# Patient Record
Sex: Male | Born: 1945
Health system: Southern US, Community
[De-identification: ages and names within clinical notes are randomized; demographics above are authoritative.]

## PROBLEM LIST (undated history)

## (undated) DIAGNOSIS — Z9289 Personal history of other medical treatment: Secondary | ICD-10-CM

## (undated) DIAGNOSIS — E118 Type 2 diabetes mellitus with unspecified complications: Secondary | ICD-10-CM

## (undated) DIAGNOSIS — E0590 Thyrotoxicosis, unspecified without thyrotoxic crisis or storm: Secondary | ICD-10-CM

## (undated) DIAGNOSIS — I639 Cerebral infarction, unspecified: Secondary | ICD-10-CM

## (undated) DIAGNOSIS — I2510 Atherosclerotic heart disease of native coronary artery without angina pectoris: Principal | ICD-10-CM

## (undated) DIAGNOSIS — Z9861 Coronary angioplasty status: Principal | ICD-10-CM

## (undated) DIAGNOSIS — E785 Hyperlipidemia, unspecified: Secondary | ICD-10-CM

## (undated) DIAGNOSIS — I10 Essential (primary) hypertension: Secondary | ICD-10-CM

## (undated) DIAGNOSIS — K219 Gastro-esophageal reflux disease without esophagitis: Secondary | ICD-10-CM

## (undated) HISTORY — DX: Hyperlipidemia, unspecified: E78.5

## (undated) HISTORY — DX: Gastro-esophageal reflux disease without esophagitis: K21.9

## (undated) HISTORY — DX: Thyrotoxicosis, unspecified without thyrotoxic crisis or storm: E05.90

## (undated) HISTORY — DX: Essential (primary) hypertension: I10

## (undated) HISTORY — DX: Coronary angioplasty status: Z98.61

## (undated) HISTORY — DX: Atherosclerotic heart disease of native coronary artery without angina pectoris: I25.10

## (undated) HISTORY — DX: Type 2 diabetes mellitus with unspecified complications: E11.8

## (undated) HISTORY — DX: Personal history of other medical treatment: Z92.89

---

## 1981-10-24 HISTORY — PX: KNEE SURGERY: SHX244

## 1999-10-25 DIAGNOSIS — I2510 Atherosclerotic heart disease of native coronary artery without angina pectoris: Secondary | ICD-10-CM

## 1999-10-25 HISTORY — DX: Atherosclerotic heart disease of native coronary artery without angina pectoris: I25.10

## 2000-06-23 ENCOUNTER — Encounter: Admission: RE | Admit: 2000-06-23 | Discharge: 2000-06-23 | Payer: Self-pay | Admitting: Cardiology

## 2000-06-23 ENCOUNTER — Encounter: Payer: Self-pay | Admitting: Cardiology

## 2000-06-30 ENCOUNTER — Ambulatory Visit (HOSPITAL_COMMUNITY): Admission: RE | Admit: 2000-06-30 | Discharge: 2000-07-01 | Payer: Self-pay | Admitting: Cardiology

## 2000-06-30 HISTORY — PX: CORONARY ANGIOPLASTY WITH STENT PLACEMENT: SHX49

## 2000-08-01 ENCOUNTER — Encounter (HOSPITAL_COMMUNITY): Admission: RE | Admit: 2000-08-01 | Discharge: 2000-10-30 | Payer: Self-pay | Admitting: Cardiology

## 2000-10-31 ENCOUNTER — Encounter (HOSPITAL_COMMUNITY): Admission: RE | Admit: 2000-10-31 | Discharge: 2001-01-29 | Payer: Self-pay | Admitting: Cardiology

## 2001-05-09 ENCOUNTER — Encounter: Admission: RE | Admit: 2001-05-09 | Discharge: 2001-05-09 | Payer: Self-pay | Admitting: Gastroenterology

## 2001-05-09 ENCOUNTER — Encounter: Payer: Self-pay | Admitting: Gastroenterology

## 2001-05-25 ENCOUNTER — Ambulatory Visit (HOSPITAL_COMMUNITY): Admission: RE | Admit: 2001-05-25 | Discharge: 2001-05-25 | Payer: Self-pay | Admitting: Gastroenterology

## 2002-02-15 ENCOUNTER — Encounter: Payer: Self-pay | Admitting: Cardiology

## 2002-02-15 ENCOUNTER — Encounter: Admission: RE | Admit: 2002-02-15 | Discharge: 2002-02-15 | Payer: Self-pay | Admitting: Cardiology

## 2002-02-22 ENCOUNTER — Ambulatory Visit (HOSPITAL_COMMUNITY): Admission: RE | Admit: 2002-02-22 | Discharge: 2002-02-22 | Payer: Self-pay | Admitting: Cardiology

## 2002-02-23 ENCOUNTER — Inpatient Hospital Stay (HOSPITAL_COMMUNITY): Admission: EM | Admit: 2002-02-23 | Discharge: 2002-02-25 | Payer: Self-pay | Admitting: Emergency Medicine

## 2002-02-23 ENCOUNTER — Encounter: Payer: Self-pay | Admitting: Cardiology

## 2002-04-29 ENCOUNTER — Ambulatory Visit (HOSPITAL_COMMUNITY): Admission: RE | Admit: 2002-04-29 | Discharge: 2002-04-30 | Payer: Self-pay | Admitting: Cardiology

## 2002-04-29 HISTORY — PX: CORONARY ANGIOPLASTY WITH STENT PLACEMENT: SHX49

## 2002-05-10 DIAGNOSIS — I2510 Atherosclerotic heart disease of native coronary artery without angina pectoris: Secondary | ICD-10-CM | POA: Insufficient documentation

## 2003-04-09 ENCOUNTER — Ambulatory Visit (HOSPITAL_COMMUNITY): Admission: RE | Admit: 2003-04-09 | Discharge: 2003-04-09 | Payer: Self-pay | Admitting: Gastroenterology

## 2004-09-29 ENCOUNTER — Encounter: Admission: RE | Admit: 2004-09-29 | Discharge: 2004-09-29 | Payer: Self-pay | Admitting: Internal Medicine

## 2005-01-22 DIAGNOSIS — Z9289 Personal history of other medical treatment: Secondary | ICD-10-CM

## 2005-01-22 HISTORY — DX: Personal history of other medical treatment: Z92.89

## 2005-02-04 ENCOUNTER — Encounter: Admission: RE | Admit: 2005-02-04 | Discharge: 2005-02-04 | Payer: Self-pay | Admitting: Cardiology

## 2005-02-10 ENCOUNTER — Ambulatory Visit (HOSPITAL_COMMUNITY): Admission: RE | Admit: 2005-02-10 | Discharge: 2005-02-10 | Payer: Self-pay | Admitting: Cardiology

## 2005-02-10 HISTORY — PX: CARDIAC CATHETERIZATION: SHX172

## 2011-11-02 DIAGNOSIS — L909 Atrophic disorder of skin, unspecified: Secondary | ICD-10-CM | POA: Diagnosis not present

## 2011-11-02 DIAGNOSIS — L919 Hypertrophic disorder of the skin, unspecified: Secondary | ICD-10-CM | POA: Diagnosis not present

## 2011-11-02 DIAGNOSIS — L570 Actinic keratosis: Secondary | ICD-10-CM | POA: Diagnosis not present

## 2011-11-21 DIAGNOSIS — E039 Hypothyroidism, unspecified: Secondary | ICD-10-CM | POA: Diagnosis not present

## 2011-11-21 DIAGNOSIS — I10 Essential (primary) hypertension: Secondary | ICD-10-CM | POA: Diagnosis not present

## 2011-11-21 DIAGNOSIS — R5383 Other fatigue: Secondary | ICD-10-CM | POA: Diagnosis not present

## 2011-11-21 DIAGNOSIS — R5381 Other malaise: Secondary | ICD-10-CM | POA: Diagnosis not present

## 2011-11-21 DIAGNOSIS — E1129 Type 2 diabetes mellitus with other diabetic kidney complication: Secondary | ICD-10-CM | POA: Diagnosis not present

## 2012-02-20 DIAGNOSIS — H25099 Other age-related incipient cataract, unspecified eye: Secondary | ICD-10-CM | POA: Diagnosis not present

## 2012-03-26 DIAGNOSIS — E039 Hypothyroidism, unspecified: Secondary | ICD-10-CM | POA: Diagnosis not present

## 2012-03-26 DIAGNOSIS — I10 Essential (primary) hypertension: Secondary | ICD-10-CM | POA: Diagnosis not present

## 2012-03-26 DIAGNOSIS — E1129 Type 2 diabetes mellitus with other diabetic kidney complication: Secondary | ICD-10-CM | POA: Diagnosis not present

## 2012-03-26 DIAGNOSIS — I2510 Atherosclerotic heart disease of native coronary artery without angina pectoris: Secondary | ICD-10-CM | POA: Diagnosis not present

## 2012-04-10 DIAGNOSIS — H02409 Unspecified ptosis of unspecified eyelid: Secondary | ICD-10-CM | POA: Diagnosis not present

## 2012-04-10 DIAGNOSIS — H02839 Dermatochalasis of unspecified eye, unspecified eyelid: Secondary | ICD-10-CM | POA: Diagnosis not present

## 2012-05-14 DIAGNOSIS — I2510 Atherosclerotic heart disease of native coronary artery without angina pectoris: Secondary | ICD-10-CM | POA: Diagnosis not present

## 2012-05-14 DIAGNOSIS — E119 Type 2 diabetes mellitus without complications: Secondary | ICD-10-CM | POA: Diagnosis not present

## 2012-05-14 DIAGNOSIS — I10 Essential (primary) hypertension: Secondary | ICD-10-CM | POA: Diagnosis not present

## 2012-07-19 DIAGNOSIS — E785 Hyperlipidemia, unspecified: Secondary | ICD-10-CM | POA: Diagnosis not present

## 2012-07-19 DIAGNOSIS — Z125 Encounter for screening for malignant neoplasm of prostate: Secondary | ICD-10-CM | POA: Diagnosis not present

## 2012-07-19 DIAGNOSIS — E1129 Type 2 diabetes mellitus with other diabetic kidney complication: Secondary | ICD-10-CM | POA: Diagnosis not present

## 2012-07-19 DIAGNOSIS — E039 Hypothyroidism, unspecified: Secondary | ICD-10-CM | POA: Diagnosis not present

## 2012-07-26 DIAGNOSIS — E669 Obesity, unspecified: Secondary | ICD-10-CM | POA: Diagnosis not present

## 2012-07-26 DIAGNOSIS — Z0000 Encounter for general adult medical examination without abnormal findings: Secondary | ICD-10-CM | POA: Diagnosis not present

## 2012-07-26 DIAGNOSIS — I10 Essential (primary) hypertension: Secondary | ICD-10-CM | POA: Diagnosis not present

## 2012-07-26 DIAGNOSIS — Z23 Encounter for immunization: Secondary | ICD-10-CM | POA: Diagnosis not present

## 2012-07-26 DIAGNOSIS — E1129 Type 2 diabetes mellitus with other diabetic kidney complication: Secondary | ICD-10-CM | POA: Diagnosis not present

## 2012-07-31 DIAGNOSIS — Z1212 Encounter for screening for malignant neoplasm of rectum: Secondary | ICD-10-CM | POA: Diagnosis not present

## 2012-09-27 DIAGNOSIS — H53459 Other localized visual field defect, unspecified eye: Secondary | ICD-10-CM | POA: Diagnosis not present

## 2012-09-27 DIAGNOSIS — H02839 Dermatochalasis of unspecified eye, unspecified eyelid: Secondary | ICD-10-CM | POA: Diagnosis not present

## 2012-09-27 DIAGNOSIS — H02409 Unspecified ptosis of unspecified eyelid: Secondary | ICD-10-CM | POA: Diagnosis not present

## 2012-10-29 DIAGNOSIS — I2510 Atherosclerotic heart disease of native coronary artery without angina pectoris: Secondary | ICD-10-CM | POA: Diagnosis not present

## 2012-10-29 DIAGNOSIS — I10 Essential (primary) hypertension: Secondary | ICD-10-CM | POA: Diagnosis not present

## 2012-10-29 DIAGNOSIS — E1129 Type 2 diabetes mellitus with other diabetic kidney complication: Secondary | ICD-10-CM | POA: Diagnosis not present

## 2013-02-25 DIAGNOSIS — I10 Essential (primary) hypertension: Secondary | ICD-10-CM | POA: Diagnosis not present

## 2013-02-25 DIAGNOSIS — E1129 Type 2 diabetes mellitus with other diabetic kidney complication: Secondary | ICD-10-CM | POA: Diagnosis not present

## 2013-02-25 DIAGNOSIS — I2510 Atherosclerotic heart disease of native coronary artery without angina pectoris: Secondary | ICD-10-CM | POA: Diagnosis not present

## 2013-02-25 DIAGNOSIS — Z6841 Body Mass Index (BMI) 40.0 and over, adult: Secondary | ICD-10-CM | POA: Diagnosis not present

## 2013-03-13 DIAGNOSIS — L570 Actinic keratosis: Secondary | ICD-10-CM | POA: Diagnosis not present

## 2013-03-13 DIAGNOSIS — D239 Other benign neoplasm of skin, unspecified: Secondary | ICD-10-CM | POA: Diagnosis not present

## 2013-03-13 DIAGNOSIS — L821 Other seborrheic keratosis: Secondary | ICD-10-CM | POA: Diagnosis not present

## 2013-03-19 DIAGNOSIS — E119 Type 2 diabetes mellitus without complications: Secondary | ICD-10-CM | POA: Diagnosis not present

## 2013-03-19 DIAGNOSIS — H5200 Hypermetropia, unspecified eye: Secondary | ICD-10-CM | POA: Diagnosis not present

## 2013-03-19 DIAGNOSIS — H52229 Regular astigmatism, unspecified eye: Secondary | ICD-10-CM | POA: Diagnosis not present

## 2013-03-19 DIAGNOSIS — H2510 Age-related nuclear cataract, unspecified eye: Secondary | ICD-10-CM | POA: Diagnosis not present

## 2013-04-24 ENCOUNTER — Encounter: Payer: Self-pay | Admitting: *Deleted

## 2013-04-29 ENCOUNTER — Encounter: Payer: Self-pay | Admitting: Cardiology

## 2013-05-01 ENCOUNTER — Encounter: Payer: Self-pay | Admitting: Cardiology

## 2013-05-01 ENCOUNTER — Ambulatory Visit (INDEPENDENT_AMBULATORY_CARE_PROVIDER_SITE_OTHER): Payer: Medicare Other | Admitting: Cardiology

## 2013-05-01 VITALS — BP 126/84 | HR 58 | Ht 69.0 in | Wt 225.1 lb

## 2013-05-01 DIAGNOSIS — I10 Essential (primary) hypertension: Secondary | ICD-10-CM | POA: Diagnosis not present

## 2013-05-01 DIAGNOSIS — E669 Obesity, unspecified: Secondary | ICD-10-CM

## 2013-05-01 DIAGNOSIS — E1169 Type 2 diabetes mellitus with other specified complication: Secondary | ICD-10-CM | POA: Insufficient documentation

## 2013-05-01 DIAGNOSIS — E785 Hyperlipidemia, unspecified: Secondary | ICD-10-CM | POA: Diagnosis not present

## 2013-05-01 DIAGNOSIS — I2510 Atherosclerotic heart disease of native coronary artery without angina pectoris: Secondary | ICD-10-CM

## 2013-05-01 NOTE — Progress Notes (Signed)
Patient ID: Nicholas Hamilton, male   DOB: [birth year], 67 y.o.   MRN: [id number]  Clinic Note: HPI: Nicholas Hamilton is a 67 y.o. male with a PMH note will for 2 vessel PCI in the past who presents today for annual followup of CAD.   He is a former patient Dr. Caprice Kluver, who is seen for the first time. He was last seen last year, and was very stable. He is working harder to lose weight and has lost 6 pounds his last visit.  Interval History: He continues to do well. Expected activity now over last couple [facility] but still working out at the [employer] and treadmill and bike. He does have the a little less "get up and go "as he had the past, but thinks it may be just due to the heat of the summer.. Certainly if he really pushes it, he gets a bit short of breath. Otherwise he really denies any chest pain or shortness of breath rest or exertion. He denies any PND, orthopnea, or edema. No lightheadedness, dizziness, wooziness, sig be ordered near-syncope. No TIA or amaurosis fugax symptoms. No claudication symptoms. No melena, hematochezia or hematuria.  Past Medical History  Diagnosis Date  . CAD (coronary artery disease) 2001    PCI to RCA - 2001 - BMS; Cx 2003 - DES  . GERD (gastroesophageal reflux disease)   . Diabetes mellitus   . Hypertension   . Hyperlipidemia   . History of stress test 01/25/2005    mildly abnormal excerise cardiolite perfusion study with the patient exercising to a 7-MET workload and achieving a peak heart rate of 136, representing 84% of predicted maxium. the pateint develop significant dyspnea during the protocol and develop excerise-induced PVC's with ventricular bigeminy. he had nondiagnostic inferolateral ST-T changes.EF 47% = Non-obstructive CAD on cath  . Hyperthyroidism     Prior Cardiac Evaluation and Past Surgical History: Past Surgical History  Procedure Laterality Date  . Cardiac catheterization  02/10/2005    EF 50%  Mild left ventricular dysfuntion with a stent ejection fraction in posterior basilar segment hypokinesis, widely patent stents in the circumflex, High-grade stenosis is a very small posterior lateral branch with a large left to right collateral bed in the same area.  . Coronary angioplasty with stent placement  04/29/2002    STENTS: A 2.5 x 28 cypher stents was placed in such a manner that both the proximal and distal portion of the obstruction in the circumflex vessel were well covered.The initial inflation was13 atmospheres for 60 seconds with final inflation being 15 atmospheres for 60 second. each inflation, the patient had burning in the back of his throat and chest pressure; it resolved with deflating the ballon  . Coronary angioplasty with stent placement  06/30/2000    STENTS: A tetra stent 3.0 x 23, the stent was placed in such a manner that the dissection was well covered and deployed at 9 atmospheres for 59 seconds with the second inflation being 10 atmospheres for 57 seconds. the stent appeared to be slightly hyperexpanded ompared to the remainder of the vessel and there was a mild stent down at the distal portion of the stent.    Allergies  Allergen Reactions  . Penicillins     Current Outpatient Prescriptions  Medication Sig Dispense Refill  . amLODipine (NORVASC) 2.5 MG tablet Take 2.5 mg by mouth daily.      [name] aspirin 81 MG tablet Take  81 mg by mouth daily.      . carvedilol (COREG) 25 MG tablet Take 25 mg by mouth 2 (two) times daily with a meal.      . fenofibrate micronized (LOFIBRA) 200 MG capsule Take 200 mg by mouth daily before breakfast.      . fish oil-omega-3 fatty acids 1000 MG capsule Take 1 g by mouth daily.      [name] glimepiride (AMARYL) 1 MG tablet Take 1 mg by mouth daily before breakfast.      . hydrochlorothiazide (HYDRODIURIL) 25 MG tablet Take 12.5 mg by mouth daily.      . INVOKANA 300 MG TABS Take 1 tablet by mouth daily.      [name] levothyroxine (SYNTHROID,  LEVOTHROID) 150 MCG tablet Take 150 mcg by mouth daily before breakfast.      . lisinopril (PRINIVIL,ZESTRIL) 40 MG tablet Take 40 mg by mouth daily.      . metFORMIN (GLUCOPHAGE) 1000 MG tablet Take 1,000 mg by mouth 2 (two) times daily with a meal.      . naproxen (NAPROSYN) 250 MG tablet Take 250 mg by mouth 2 (two) times daily with a meal.      . nitroGLYCERIN (NITROSTAT) 0.4 MG SL tablet Place 0.4 mg under the tongue every 5 (five) minutes as needed for chest pain.      [name] omeprazole (PRILOSEC) 20 MG capsule Take 20 mg by mouth daily.      . simvastatin (ZOCOR) 40 MG tablet Take 40 mg by mouth every evening.       No current facility-administered medications for this visit.    History   Social History  . Marital Status: Married    Spouse Name: N/A    Number of Children: N/A  . Years of Education: N/A   Occupational History  . Not on file.   Social History Main Topics  . Smoking status: Former Smoker -- 1.00 packs/day for 40 years    Types: Cigarettes    Quit date: 10/24/2000  . Smokeless tobacco: Former Neurosurgeon    Types: Chew  . Alcohol Use: Yes     Comment: Very seldom  . Drug Use: No  . Sexually Active: Not on file   Other Topics Concern  . Not on file   Social History Narrative   : Married, 1 child. Chews tobacco, rare alcohol. [facility]. Talks about   wanting to lose weight.  Per Dr. Clarene Duke, he needs to continue his exercise and diet and stop snacking between meals.     ROS: A comprehensive Review of Systems - Negative except pertinent positives above; slightly less "get up & go"; ++ 6lb wgt loss!; positional vertigo (worse when lying down); mild Bilateral knee OA Sx.   PHYSICAL EXAM BP 126/84  Pulse 58  Ht 5\' 9"  (1.753 m)  Wt 225 lb 1.6 oz (102.105 kg)  BMI 33.23 kg/m2 General appearance: alert, cooperative, appears stated age, mild distress and mildly obese Neck: faint R carotid bruit; no LAD or JVD; supple, symmetrical, trachea midline and  thyroid not enlarged, symmetric, no tenderness/mass/nodules Lungs: clear to auscultation bilaterally, normal percussion bilaterally and non-labored, good air movement Heart: regular rate and rhythm, S1, S2 normal, no murmur, click, rub or gallop and normal apical impulse Abdomen: soft, non-tender; bowel sounds normal; no masses,  no organomegaly and mild-moderate truncal obesity Extremities: extremities normal, atraumatic, no cyanosis or edema, no edema, redness or tenderness in the calves or thighs and  no ulcers, gangrene or trophic changes Pulses: 2+ and symmetric Neurologic: Grossly normal; normal mood and affect HEENT: [place]/AT, EOMI, MMM, anicteric sclera   [id number] today: Yes Rate:58 , Rhythm: sinus bradycardia, Non-specific ST-T changes;    Recent Labs: Pending this fall   ASSESSMENT / PLAN: Stable from a cardiovascular standpoint.  BP & HR stable.    CAD S/P percutaneous coronary angioplasty Very stable, no active symptoms. On a good dose of beta blocker, aspirin, ACE inhibitor, statin. No active symptoms. Based on the back is a false positive stress test in the past, he is reluctant to have the screening/followup stress test done.  Obesity (BMI 30-39.9) Continue diet next a modification. He still as a snack at night which is minimal the problems he had in the past. Is hoping that once the heat of summer goes away, he is able to pick up his exercise level that would help him feel like exercising more  Hypertension Blood pressure well controlled, he is on a pretty good amount of medication with next dose carvedilol and lisinopril. His on a small dose of HCTZ and amlodipine.  Hyperlipidemia I believe his labs be followed by his primary. He is on fenofibrate as well as simvastatin. I've asked that he have his followup labs from Dr. Jarold Motto forwarded to her office when completed this fall.    Orders Placed This Encounter  Procedures  . EKG 12-Lead    Followup: 1  yr  DAVID W. Herbie Baltimore, M.D., M.S. [facility] [address] [place], [place]  [address]  [phone] Pager # [phone]

## 2013-05-01 NOTE — Patient Instructions (Signed)
You continue to be healthy from a heart perspective.  Your blood pressure & Heart rate look great. I look forward to seeing your cholesterol reports.  Keep it up with the routine exercise -- congratulations on ~6 lb weight loss in the past year!!!  I will simply see you back in a year.  Marykay Lex, MD

## 2013-05-10 ENCOUNTER — Encounter: Payer: Self-pay | Admitting: Cardiology

## 2013-05-10 DIAGNOSIS — E669 Obesity, unspecified: Secondary | ICD-10-CM | POA: Insufficient documentation

## 2013-05-10 DIAGNOSIS — E66811 Obesity, class 1: Secondary | ICD-10-CM | POA: Insufficient documentation

## 2013-05-10 NOTE — Assessment & Plan Note (Signed)
Blood pressure well controlled, he is on a pretty good amount of medication with next dose carvedilol and lisinopril. His on a small dose of HCTZ and amlodipine.

## 2013-05-10 NOTE — Assessment & Plan Note (Signed)
Very stable, no active symptoms. On a good dose of beta blocker, aspirin, ACE inhibitor, statin. No active symptoms. Based on the back is a false positive stress test in the past, he is reluctant to have the screening/followup stress test done.

## 2013-05-10 NOTE — Assessment & Plan Note (Signed)
Continue diet next a modification. He still as a snack at night which is minimal the problems he had in the past. Is hoping that once the heat of summer goes away, he is able to pick up his exercise level that would help him feel like exercising more

## 2013-05-10 NOTE — Assessment & Plan Note (Signed)
I believe his labs be followed by his primary. He is on fenofibrate as well as simvastatin. I've asked that he have his followup labs from Dr. Jarold Motto forwarded to her office when completed this fall.

## 2013-07-24 DIAGNOSIS — Z125 Encounter for screening for malignant neoplasm of prostate: Secondary | ICD-10-CM | POA: Diagnosis not present

## 2013-07-24 DIAGNOSIS — E039 Hypothyroidism, unspecified: Secondary | ICD-10-CM | POA: Diagnosis not present

## 2013-07-24 DIAGNOSIS — E785 Hyperlipidemia, unspecified: Secondary | ICD-10-CM | POA: Diagnosis not present

## 2013-07-31 DIAGNOSIS — Z0000 Encounter for general adult medical examination without abnormal findings: Secondary | ICD-10-CM | POA: Diagnosis not present

## 2013-07-31 DIAGNOSIS — I2510 Atherosclerotic heart disease of native coronary artery without angina pectoris: Secondary | ICD-10-CM | POA: Diagnosis not present

## 2013-07-31 DIAGNOSIS — Z79899 Other long term (current) drug therapy: Secondary | ICD-10-CM | POA: Diagnosis not present

## 2013-07-31 DIAGNOSIS — E669 Obesity, unspecified: Secondary | ICD-10-CM | POA: Diagnosis not present

## 2013-07-31 DIAGNOSIS — Z23 Encounter for immunization: Secondary | ICD-10-CM | POA: Diagnosis not present

## 2013-07-31 DIAGNOSIS — I10 Essential (primary) hypertension: Secondary | ICD-10-CM | POA: Diagnosis not present

## 2013-07-31 DIAGNOSIS — E039 Hypothyroidism, unspecified: Secondary | ICD-10-CM | POA: Diagnosis not present

## 2013-07-31 DIAGNOSIS — E785 Hyperlipidemia, unspecified: Secondary | ICD-10-CM | POA: Diagnosis not present

## 2013-07-31 DIAGNOSIS — E1129 Type 2 diabetes mellitus with other diabetic kidney complication: Secondary | ICD-10-CM | POA: Diagnosis not present

## 2013-07-31 DIAGNOSIS — Z1331 Encounter for screening for depression: Secondary | ICD-10-CM | POA: Diagnosis not present

## 2013-08-05 DIAGNOSIS — Z1212 Encounter for screening for malignant neoplasm of rectum: Secondary | ICD-10-CM | POA: Diagnosis not present

## 2013-11-27 DIAGNOSIS — N183 Chronic kidney disease, stage 3 unspecified: Secondary | ICD-10-CM | POA: Diagnosis not present

## 2013-11-27 DIAGNOSIS — I2510 Atherosclerotic heart disease of native coronary artery without angina pectoris: Secondary | ICD-10-CM | POA: Diagnosis not present

## 2013-11-27 DIAGNOSIS — Z6833 Body mass index (BMI) 33.0-33.9, adult: Secondary | ICD-10-CM | POA: Diagnosis not present

## 2013-11-27 DIAGNOSIS — E1129 Type 2 diabetes mellitus with other diabetic kidney complication: Secondary | ICD-10-CM | POA: Diagnosis not present

## 2013-11-27 DIAGNOSIS — E785 Hyperlipidemia, unspecified: Secondary | ICD-10-CM | POA: Diagnosis not present

## 2013-11-27 DIAGNOSIS — I10 Essential (primary) hypertension: Secondary | ICD-10-CM | POA: Diagnosis not present

## 2014-03-12 DIAGNOSIS — M1710 Unilateral primary osteoarthritis, unspecified knee: Secondary | ICD-10-CM | POA: Diagnosis not present

## 2014-04-15 DIAGNOSIS — M25569 Pain in unspecified knee: Secondary | ICD-10-CM | POA: Diagnosis not present

## 2014-04-15 DIAGNOSIS — M1710 Unilateral primary osteoarthritis, unspecified knee: Secondary | ICD-10-CM | POA: Diagnosis not present

## 2014-04-22 DIAGNOSIS — M25569 Pain in unspecified knee: Secondary | ICD-10-CM | POA: Diagnosis not present

## 2014-05-01 DIAGNOSIS — I2510 Atherosclerotic heart disease of native coronary artery without angina pectoris: Secondary | ICD-10-CM | POA: Diagnosis not present

## 2014-05-01 DIAGNOSIS — Z1331 Encounter for screening for depression: Secondary | ICD-10-CM | POA: Diagnosis not present

## 2014-05-01 DIAGNOSIS — I10 Essential (primary) hypertension: Secondary | ICD-10-CM | POA: Diagnosis not present

## 2014-05-01 DIAGNOSIS — Z6833 Body mass index (BMI) 33.0-33.9, adult: Secondary | ICD-10-CM | POA: Diagnosis not present

## 2014-05-01 DIAGNOSIS — N183 Chronic kidney disease, stage 3 unspecified: Secondary | ICD-10-CM | POA: Diagnosis not present

## 2014-05-01 DIAGNOSIS — I739 Peripheral vascular disease, unspecified: Secondary | ICD-10-CM | POA: Diagnosis not present

## 2014-05-01 DIAGNOSIS — E1129 Type 2 diabetes mellitus with other diabetic kidney complication: Secondary | ICD-10-CM | POA: Diagnosis not present

## 2014-05-08 DIAGNOSIS — IMO0002 Reserved for concepts with insufficient information to code with codable children: Secondary | ICD-10-CM | POA: Diagnosis not present

## 2014-05-09 ENCOUNTER — Other Ambulatory Visit: Payer: Self-pay | Admitting: Physician Assistant

## 2014-05-09 DIAGNOSIS — D485 Neoplasm of uncertain behavior of skin: Secondary | ICD-10-CM | POA: Diagnosis not present

## 2014-05-09 DIAGNOSIS — L570 Actinic keratosis: Secondary | ICD-10-CM | POA: Diagnosis not present

## 2014-05-09 DIAGNOSIS — C44211 Basal cell carcinoma of skin of unspecified ear and external auricular canal: Secondary | ICD-10-CM | POA: Diagnosis not present

## 2014-06-09 ENCOUNTER — Ambulatory Visit (INDEPENDENT_AMBULATORY_CARE_PROVIDER_SITE_OTHER): Payer: Medicare Other | Admitting: Cardiology

## 2014-06-09 ENCOUNTER — Telehealth: Payer: Self-pay | Admitting: Cardiology

## 2014-06-09 ENCOUNTER — Encounter: Payer: Self-pay | Admitting: Cardiology

## 2014-06-09 VITALS — BP 112/62 | HR 53 | Ht 68.5 in | Wt 220.1 lb

## 2014-06-09 DIAGNOSIS — E669 Obesity, unspecified: Secondary | ICD-10-CM

## 2014-06-09 DIAGNOSIS — I10 Essential (primary) hypertension: Secondary | ICD-10-CM | POA: Diagnosis not present

## 2014-06-09 DIAGNOSIS — Z9861 Coronary angioplasty status: Secondary | ICD-10-CM

## 2014-06-09 DIAGNOSIS — I2510 Atherosclerotic heart disease of native coronary artery without angina pectoris: Secondary | ICD-10-CM

## 2014-06-09 DIAGNOSIS — E785 Hyperlipidemia, unspecified: Secondary | ICD-10-CM

## 2014-06-09 NOTE — Patient Instructions (Signed)
Your physician encouraged you to lose weight for better health.  CONTINUE CURRENT MEDICATION  Your physician wants you to follow-up in [place].  You will receive a reminder letter in the mail two months in advance. If you don't receive a letter, please call our office to schedule the follow-up appointment.

## 2014-06-09 NOTE — Telephone Encounter (Signed)
Will make Nicholas Hamilton and dr harding aware

## 2014-06-09 NOTE — Telephone Encounter (Signed)
Pt has an appt today at 2 and she wants Dr Ellyn Hack to question him about his eating habit. She says patient eats everything he is not suppose to eat.

## 2014-06-10 ENCOUNTER — Encounter: Payer: Self-pay | Admitting: Cardiology

## 2014-06-10 MED ORDER — NITROGLYCERIN 0.4 MG SL SUBL: 0.4000 mg | SUBLINGUAL_TABLET | SUBLINGUAL | Status: DC | PRN

## 2014-06-10 NOTE — Assessment & Plan Note (Signed)
Well-controlled on current medications. To streamline his medications, to consider stopping amlodipine and increasing HCTZ 25 mg.

## 2014-06-10 NOTE — Progress Notes (Signed)
PCP: Donnajean Lopes, MD  Clinic Note: Chief Complaint  Patient presents with  . Follow-up    1 year. denies chest pain, dyspnea and swelling.     HPI: Nicholas Hamilton is a 68 y.o. male with a Cardiovascular Problem List below who presents today for his coronary disease status post PCI to RCA & Cx.  His "anginal equivalent was exertional dyspnea with hot and cold intolerance.  Interval History: He presents today for annual followup without any major complaints. He claims that he has lost more weight than his recorded but gained some back because he got out of sync with exercise over the last few months. He has bad knees --most notably the right,  and has been having to take care of his wife and mother who have been ill recently. He is trying to stay active but has not been exercising as well and has also not been paying attention to his diet because of time constraints. He denies any resting or exertional dyspnea or chest tightness/angina. No hot or cold intolerance.  No PND, orthopnea or edema.  No palpitations, lightheadedness, dizziness, weakness or syncope/near syncope. No TIA/amaurosis fugax symptoms. No melena, hematochezia, hematuria, or epstaxis. No claudication.  Past Medical History  Diagnosis Date  . CAD S/P percutaneous coronary angioplasty 2001    PCI to RCA - 2001 (Tetra BMS 3.0 mm x 23 mm)  - BMS; Cx 2003 - Cypher DES 2.5 mm x 28 mm; CAATH 2006 - 80% stenosis in prox RPL with L-R collaterals, too small for PCI - Med Rx  . History of stress test - Exercise Cardiolite 01/2005    mildly abnormal; 7-METS, peak HR 136 = 84% of max predicted; symptoms:significant dyspnea with exercise induced PVCs-ventricular bigeminy. nondiagnostic inferolateral ST-T changes.EF 47% = Non-obstructive CAD with the exception of 80% RPL stenosis on cath  . Diabetes mellitus type 2 with complications     CAD  . Hypertension, essential   . Hyperlipidemia LDL goal <70   . Hyperthyroidism   .  GERD (gastroesophageal reflux disease)    Prior Cardiac Evaluation and Past Surgical History: Reviewed in [place] [facility] No Change in Social and Family History  ROS: A comprehensive Review of Systems - was performed Review of Systems  Constitutional: Negative for fever, chills and malaise/fatigue.       No hot or cold intolerance  Musculoskeletal: Positive for joint pain. Negative for myalgias.       Bilateral but left greater than right knee pain. Has been seen by orthopedic surgery; trying to avoid surgery  All other systems reviewed and are negative.   Wt Readings from Last 3 Encounters:  06/09/14 220 lb 1.6 oz (99.837 kg)  05/01/13 225 lb 1.6 oz (102.105 kg)    PHYSICAL EXAM BP 112/62  Pulse 53  Ht 5' 8.5" (1.74 m)  Wt 220 lb 1.6 oz (99.837 kg)  BMI 32.98 kg/m2 General appearance: alert, cooperative, appears stated age, mild distress and mildly obese  HEENT: /AT, EOMI, MMM, anicteric sclera Neck: faint R carotid bruit; no LAD or JVD; supple, symmetrical, trachea midline and thyroid not enlarged, symmetric, no tenderness/mass/nodules  Lungs: clear to auscultation bilaterally, normal percussion bilaterally and non-labored, good air movement  Heart: regular rate and rhythm, S1, S2 normal, no murmur, click, rub or gallop and normal apical impulse  Abdomen: soft, non-tender; bowel sounds normal; no masses, no organomegaly and mild-moderate truncal obesity  Extremities: extremities normal, atraumatic, no cyanosis  or edema, no edema, redness or tenderness in the calves or thighs and no ulcers, gangrene or trophic changes  Pulses: 2+ and symmetric  Neurologic: Grossly normal; normal mood and affect    Adult ECG Report  Rate: 53 ;  Rhythm: sinus bradycardia and 1 AV block; otherwise normal EKG  Recent Labs not available. Monitored by PCP.:   ASSESSMENT / PLAN: CAD S/P percutaneous coronary angioplasty Stable with no active symptoms. Plan:  Continue current dose of carvedilol and lisinopril plus low-dose amlodipine. On aspirin and statin.  Hyperlipidemia with target LDL less than 70 Monitored by PCP. On statin plus fenofibrate.  Essential hypertension Well-controlled on current medications. To streamline his medications, to consider stopping amlodipine and increasing HCTZ 25 mg.  Obesity (BMI 30-39.9) Again he saw the palm of snacking in between meals. His wife is actually concerned enough to call and discuss this. He admits that he has not been doing for a while his dietary discretion. He eats out, and snacks all time on junk food. He basically admits that he eats whatever he wants without much thought. His wife was concerned enough to call in about this. I did spend some time counseling him on the importance of monitoring his diet and trying to adopt a more Mediterranean/DASH diet. This is in conjunction with increasing his exercise level.    Orders Placed This Encounter  Procedures  . EKG 12-Lead   Meds ordered this encounter  Medications  . nitroGLYCERIN (NITROSTAT) 0.4 MG SL tablet    Sig: Place 1 tablet (0.4 mg total) under the tongue every 5 (five) minutes as needed for chest pain.    Dispense:  25 tablet    Refill:  1    Followup: 12 months  DAVID W. Ellyn Hack, M.D., M.S. Interventional Cardiologist [facility]

## 2014-06-10 NOTE — Assessment & Plan Note (Signed)
Monitored by PCP. On statin plus fenofibrate.

## 2014-06-10 NOTE — Assessment & Plan Note (Signed)
Again he saw the palm of snacking in between meals. His wife is actually concerned enough to call and discuss this. He admits that he has not been doing for a while his dietary discretion. He eats out, and snacks all time on junk food. He basically admits that he eats whatever he wants without much thought. His wife was concerned enough to call in about this. I did spend some time counseling him on the importance of monitoring his diet and trying to adopt a more Mediterranean/DASH diet. This is in conjunction with increasing his exercise level.

## 2014-06-10 NOTE — Assessment & Plan Note (Signed)
Stable with no active symptoms. Plan: Continue current dose of carvedilol and lisinopril plus low-dose amlodipine. On aspirin and statin.

## 2014-06-12 DIAGNOSIS — C44211 Basal cell carcinoma of skin of unspecified ear and external auricular canal: Secondary | ICD-10-CM | POA: Diagnosis not present

## 2014-08-07 DIAGNOSIS — Z125 Encounter for screening for malignant neoplasm of prostate: Secondary | ICD-10-CM | POA: Diagnosis not present

## 2014-08-07 DIAGNOSIS — E785 Hyperlipidemia, unspecified: Secondary | ICD-10-CM | POA: Diagnosis not present

## 2014-08-07 DIAGNOSIS — E1129 Type 2 diabetes mellitus with other diabetic kidney complication: Secondary | ICD-10-CM | POA: Diagnosis not present

## 2014-08-07 DIAGNOSIS — E039 Hypothyroidism, unspecified: Secondary | ICD-10-CM | POA: Diagnosis not present

## 2014-08-14 DIAGNOSIS — Z23 Encounter for immunization: Secondary | ICD-10-CM | POA: Diagnosis not present

## 2014-08-14 DIAGNOSIS — E669 Obesity, unspecified: Secondary | ICD-10-CM | POA: Diagnosis not present

## 2014-08-14 DIAGNOSIS — I739 Peripheral vascular disease, unspecified: Secondary | ICD-10-CM | POA: Diagnosis not present

## 2014-08-14 DIAGNOSIS — I10 Essential (primary) hypertension: Secondary | ICD-10-CM | POA: Diagnosis not present

## 2014-08-14 DIAGNOSIS — Z0000 Encounter for general adult medical examination without abnormal findings: Secondary | ICD-10-CM | POA: Diagnosis not present

## 2014-08-14 DIAGNOSIS — N183 Chronic kidney disease, stage 3 (moderate): Secondary | ICD-10-CM | POA: Diagnosis not present

## 2014-08-14 DIAGNOSIS — E1151 Type 2 diabetes mellitus with diabetic peripheral angiopathy without gangrene: Secondary | ICD-10-CM | POA: Diagnosis not present

## 2014-08-14 DIAGNOSIS — I2510 Atherosclerotic heart disease of native coronary artery without angina pectoris: Secondary | ICD-10-CM | POA: Diagnosis not present

## 2014-08-14 DIAGNOSIS — Z6833 Body mass index (BMI) 33.0-33.9, adult: Secondary | ICD-10-CM | POA: Diagnosis not present

## 2014-08-15 DIAGNOSIS — Z1212 Encounter for screening for malignant neoplasm of rectum: Secondary | ICD-10-CM | POA: Diagnosis not present

## 2014-09-16 DIAGNOSIS — H5010 Unspecified exotropia: Secondary | ICD-10-CM | POA: Diagnosis not present

## 2014-09-16 DIAGNOSIS — H52203 Unspecified astigmatism, bilateral: Secondary | ICD-10-CM | POA: Diagnosis not present

## 2014-09-16 DIAGNOSIS — H25013 Cortical age-related cataract, bilateral: Secondary | ICD-10-CM | POA: Diagnosis not present

## 2014-10-27 DIAGNOSIS — M7541 Impingement syndrome of right shoulder: Secondary | ICD-10-CM | POA: Diagnosis not present

## 2014-10-29 DIAGNOSIS — D239 Other benign neoplasm of skin, unspecified: Secondary | ICD-10-CM | POA: Diagnosis not present

## 2014-10-29 DIAGNOSIS — L821 Other seborrheic keratosis: Secondary | ICD-10-CM | POA: Diagnosis not present

## 2014-11-13 DIAGNOSIS — H25811 Combined forms of age-related cataract, right eye: Secondary | ICD-10-CM | POA: Diagnosis not present

## 2014-11-13 DIAGNOSIS — H25011 Cortical age-related cataract, right eye: Secondary | ICD-10-CM | POA: Diagnosis not present

## 2014-11-13 DIAGNOSIS — H2511 Age-related nuclear cataract, right eye: Secondary | ICD-10-CM | POA: Diagnosis not present

## 2014-11-13 DIAGNOSIS — H25041 Posterior subcapsular polar age-related cataract, right eye: Secondary | ICD-10-CM | POA: Diagnosis not present

## 2014-12-11 DIAGNOSIS — E1151 Type 2 diabetes mellitus with diabetic peripheral angiopathy without gangrene: Secondary | ICD-10-CM | POA: Diagnosis not present

## 2014-12-11 DIAGNOSIS — Z6833 Body mass index (BMI) 33.0-33.9, adult: Secondary | ICD-10-CM | POA: Diagnosis not present

## 2014-12-11 DIAGNOSIS — R0683 Snoring: Secondary | ICD-10-CM | POA: Diagnosis not present

## 2014-12-11 DIAGNOSIS — E119 Type 2 diabetes mellitus without complications: Secondary | ICD-10-CM | POA: Diagnosis not present

## 2014-12-11 DIAGNOSIS — I10 Essential (primary) hypertension: Secondary | ICD-10-CM | POA: Diagnosis not present

## 2014-12-11 DIAGNOSIS — I739 Peripheral vascular disease, unspecified: Secondary | ICD-10-CM | POA: Diagnosis not present

## 2014-12-11 DIAGNOSIS — I2510 Atherosclerotic heart disease of native coronary artery without angina pectoris: Secondary | ICD-10-CM | POA: Diagnosis not present

## 2015-03-26 DIAGNOSIS — I10 Essential (primary) hypertension: Secondary | ICD-10-CM | POA: Diagnosis not present

## 2015-03-26 DIAGNOSIS — I2510 Atherosclerotic heart disease of native coronary artery without angina pectoris: Secondary | ICD-10-CM | POA: Diagnosis not present

## 2015-03-26 DIAGNOSIS — I739 Peripheral vascular disease, unspecified: Secondary | ICD-10-CM | POA: Diagnosis not present

## 2015-03-26 DIAGNOSIS — Z6832 Body mass index (BMI) 32.0-32.9, adult: Secondary | ICD-10-CM | POA: Diagnosis not present

## 2015-03-26 DIAGNOSIS — Z23 Encounter for immunization: Secondary | ICD-10-CM | POA: Diagnosis not present

## 2015-03-26 DIAGNOSIS — E1151 Type 2 diabetes mellitus with diabetic peripheral angiopathy without gangrene: Secondary | ICD-10-CM | POA: Diagnosis not present

## 2015-03-26 DIAGNOSIS — Z1389 Encounter for screening for other disorder: Secondary | ICD-10-CM | POA: Diagnosis not present

## 2015-03-31 DIAGNOSIS — H532 Diplopia: Secondary | ICD-10-CM | POA: Diagnosis not present

## 2015-04-30 DIAGNOSIS — H5015 Alternating exotropia: Secondary | ICD-10-CM | POA: Diagnosis not present

## 2015-06-11 DIAGNOSIS — M7541 Impingement syndrome of right shoulder: Secondary | ICD-10-CM | POA: Diagnosis not present

## 2015-06-11 DIAGNOSIS — M7542 Impingement syndrome of left shoulder: Secondary | ICD-10-CM | POA: Diagnosis not present

## 2015-06-18 DIAGNOSIS — M7541 Impingement syndrome of right shoulder: Secondary | ICD-10-CM | POA: Diagnosis not present

## 2015-06-18 DIAGNOSIS — M7542 Impingement syndrome of left shoulder: Secondary | ICD-10-CM | POA: Diagnosis not present

## 2015-06-19 ENCOUNTER — Ambulatory Visit (INDEPENDENT_AMBULATORY_CARE_PROVIDER_SITE_OTHER): Payer: Medicare Other | Admitting: Cardiology

## 2015-06-19 ENCOUNTER — Encounter: Payer: Self-pay | Admitting: Cardiology

## 2015-06-19 VITALS — BP 110/66 | HR 55 | Ht 69.0 in | Wt 213.0 lb

## 2015-06-19 DIAGNOSIS — I10 Essential (primary) hypertension: Secondary | ICD-10-CM | POA: Diagnosis not present

## 2015-06-19 DIAGNOSIS — Z9861 Coronary angioplasty status: Secondary | ICD-10-CM | POA: Diagnosis not present

## 2015-06-19 DIAGNOSIS — E785 Hyperlipidemia, unspecified: Secondary | ICD-10-CM

## 2015-06-19 DIAGNOSIS — E669 Obesity, unspecified: Secondary | ICD-10-CM | POA: Diagnosis not present

## 2015-06-19 DIAGNOSIS — I2510 Atherosclerotic heart disease of native coronary artery without angina pectoris: Secondary | ICD-10-CM

## 2015-06-19 NOTE — Patient Instructions (Signed)
NO CHANGE IN CURRENT MEDICATIONS  Your physician wants you to follow-up in 12 MONTHS DR HARDING.  You will receive a reminder letter in the mail two months in advance. If you don't receive a letter, please call our office to schedule the follow-up appointment.

## 2015-06-19 NOTE — Progress Notes (Signed)
PCP: Donnajean Lopes, MD  Clinic Note: Chief Complaint  Patient presents with  . Annual Exam    Patient has no complaints.  . Coronary Artery Disease    HPI: Nicholas Hamilton is a 69 y.o. male with a Cardiovascular Problem List below who presents today for his coronary disease status post PCI to RCA & Cx.  His "anginal equivalent was exertional dyspnea with hot and cold intolerance.  Usually goes to [facility] to exercise 3 d /wk -- but has missed a lot lately due to other distractions.  Interval History: He presents today for annual followup without any major complaints. He continues to exercise - but has been out of sync recently with other responsibilities.   He is trying to stay active but has not been exercising as well and has also not been paying attention to his diet because of time constraints. He denies any resting or exertional dyspnea or chest tightness/angina. No hot or cold intolerance.  No PND, orthopnea or edema.  No palpitations, weakness or syncope/near syncope.  However he does occasionally get a little lightheaded in the mornings after getting his medications. Not any syncopal episodes. No TIA/amaurosis fugax symptoms. No claudication.  He is due to have labs checked by his PCP in September.  Past Medical History  Diagnosis Date  . CAD S/P percutaneous coronary angioplasty 2001    PCI to RCA - 2001 (Tetra BMS 3.0 mm x 23 mm)  - BMS; Cx 2003 - Cypher DES 2.5 mm x 28 mm; CAATH 2006 - 80% stenosis in prox RPL with L-R collaterals, too small for PCI - Med Rx  . History of stress test - Exercise Cardiolite 01/2005    mildly abnormal; 7-METS, peak HR 136 = 84% of max predicted; symptoms:significant dyspnea with exercise induced PVCs-ventricular bigeminy. nondiagnostic inferolateral ST-T changes.EF 47% = Non-obstructive CAD with the exception of 80% RPL stenosis on cath  . Diabetes mellitus type 2 with complications     CAD  . Hypertension, essential   . Hyperlipidemia  LDL goal <70   . Hyperthyroidism   . GERD (gastroesophageal reflux disease)     ROS: A comprehensive Review of Systems - was performed Review of Systems  Constitutional: Positive for weight loss (Intentional). Negative for fever, chills and malaise/fatigue.       No hot or cold intolerance  Gastrointestinal: Negative for blood in stool and melena.  Genitourinary: Negative for hematuria.  Musculoskeletal: Positive for joint pain (Bilateral Shouder bursitis). Negative for myalgias.       Bilateral but left greater than right knee pain. Has been seen by orthopedic surgery; trying to avoid surgery  Endo/Heme/Allergies: Does not bruise/bleed easily.  All other systems reviewed and are negative.  Past Surgical History  Procedure Laterality Date  . Cardiac catheterization  02/10/2005    EF 50% Mild left ventricular dysfuntion with a stent ejection fraction in posterior basilar segment hypokinesis, widely patent stents in the circumflex, High-grade stenosis is a very small posterior lateral branch with a large left to right collateral bed in the same area.  . Coronary angioplasty with stent placement  04/29/2002    STENTS: A 2.5 x 28 cypher stents was placed in such a manner that both the proximal and distal portion of the obstruction in the circumflex vessel were well covered.The initial inflation was13 atmospheres for 60 seconds with final inflation being 15 atmospheres for 60 second. each inflation, the patient had burning in the back of his throat and chest  pressure; it resolved with deflating the ballon  . Coronary angioplasty with stent placement  06/30/2000    STENTS: A tetra stent 3.0 x 23, the stent was placed in such a manner that the dissection was well covered and deployed at 9 atmospheres for 59 seconds with the second inflation being 10 atmospheres for 57 seconds. the stent appeared to be slightly hyperexpanded ompared to the remainder of the vessel and there was a mild stent down at the  distal portion of the stent.   Prior to Admission medications   Medication Sig Start Date End Date Taking? Authorizing Provider  amLODipine (NORVASC) 2.5 MG tablet Take 2.5 mg by mouth daily.   Yes Historical Provider, MD  aspirin 81 MG tablet Take 81 mg by mouth daily.   Yes Historical Provider, MD  carvedilol (COREG) 25 MG tablet Take 25 mg by mouth 2 (two) times daily with a meal.   Yes Historical Provider, MD  fenofibrate micronized (LOFIBRA) 200 MG capsule Take 200 mg by mouth daily before breakfast.   Yes Historical Provider, MD  fish oil-omega-3 fatty acids 1000 MG capsule Take 1 g by mouth daily.   Yes Historical Provider, MD  glimepiride (AMARYL) 1 MG tablet Take 1 mg by mouth daily before breakfast.   Yes Historical Provider, MD  hydrochlorothiazide (HYDRODIURIL) 25 MG tablet Take 12.5 mg by mouth daily.   Yes Historical Provider, MD  INVOKANA 300 MG TABS Take 1 tablet by mouth daily. 04/19/13  Yes Historical Provider, MD  levothyroxine (SYNTHROID, LEVOTHROID) 150 MCG tablet Take 150 mcg by mouth daily before breakfast.   Yes Historical Provider, MD  lisinopril (PRINIVIL,ZESTRIL) 40 MG tablet Take 40 mg by mouth daily.   Yes Historical Provider, MD  metFORMIN (GLUCOPHAGE) 1000 MG tablet Take 1,000 mg by mouth 2 (two) times daily with a meal.   Yes Historical Provider, MD  naproxen (NAPROSYN) 250 MG tablet Take 250 mg by mouth 2 (two) times daily with a meal.   Yes Historical Provider, MD  nitroGLYCERIN (NITROSTAT) 0.4 MG SL tablet Place 1 tablet (0.4 mg total) under the tongue every 5 (five) minutes as needed for chest pain. 06/10/14  Yes Leonie Man, MD  omeprazole (PRILOSEC) 20 MG capsule Take 20 mg by mouth daily.   Yes Historical Provider, MD  simvastatin (ZOCOR) 40 MG tablet Take 40 mg by mouth every evening.   Yes Historical Provider, MD   Allergies  Allergen Reactions  . Penicillins    Family History  Problem Relation Age of Onset  . Diabetes Mother   . Stroke Mother     . Bell's palsy Mother   . Heart attack Mother   . Stroke Father     x 3  . Heart attack Father   . Arthritis Sister    Social History  Substance Use Topics  . Smoking status: Former Smoker -- 1.00 packs/day for 40 years    Types: Cigarettes    Quit date: 10/24/2000  . Smokeless tobacco: Former Systems developer    Types: Chew  . Alcohol Use: Yes     Comment: Very seldom    Wt Readings from Last 3 Encounters:  06/19/15 213 lb (96.616 kg)  06/09/14 220 lb 1.6 oz (99.837 kg)  05/01/13 225 lb 1.6 oz (102.105 kg)    PHYSICAL EXAM BP 110/66 mmHg  Pulse 55  Ht 5\' 9"  (1.753 m)  Wt 213 lb (96.616 kg)  BMI 31.44 kg/m2 General appearance: alert, cooperative, appears stated age, mild  distress and mildly obese  HEENT: [place]/AT, EOMI, MMM, anicteric sclera Neck: faint R carotid bruit; no LAD or JVD; supple, symmetrical, trachea midline and thyroid not enlarged, symmetric, no tenderness/mass/nodules  Lungs: clear to auscultation bilaterally, normal percussion bilaterally and non-labored, good air movement  Heart: regular rate and rhythm, S1, S2 normal, no murmur, click, rub or gallop and normal apical impulse  Abdomen: soft, non-tender; bowel sounds normal; no masses, no organomegaly and mild-moderate truncal obesity  Extremities: extremities normal, atraumatic, no cyanosis or edema, no edema, redness or tenderness in the calves or thighs and no ulcers, gangrene or trophic changes  Pulses: 2+ and symmetric  Neurologic: Grossly normal; normal mood and affect    Adult ECG Report  Rate: 55 ;  Rhythm: sinus bradycardia and 1 AV block; otherwise normal EKG - perhaps pooranterior R wave progression, suggesting possible anterior infarct, age undetermined. More likely consistent with lead placement. Stable EKG  Recent Labs not available. Monitored by PCP.:   ASSESSMENT / PLAN: Problem List Items Addressed This Visit    CAD S/P percutaneous coronary angioplasty (Chronic)    Stable with no active anginal  symptoms. Remains on aspirin, statin as well as beta blocker and ACE inhibitor. On calcium channel blocker for blood pressure as well as antianginal effect.      Essential hypertension - Primary (Chronic)    Well-controlled on current medications. At this point since he is having a little bit of dizziness with medications, it is recommended he takes amlodipine with the Indocin carvedilol as opposed to in the morning when the combination of HCTZ and ACE inhibitor. Consider dropping amlodipine if his blood pressure continues to run in the 110s.      Hyperlipidemia with target LDL less than 70 (Chronic)    He is on statin, fenofibrate and 3 fatty acids. Labs were monitored by PCP. I don't have recent values.      Obesity (BMI 30-39.9) (Chronic)    He has been doing a pretty good job with steady weight loss. 7 pounds in the last year which followed an additional 5 pounds prior to that. He has been trying really hard to adjust his diet and increase his exercise level. I congratulated him on his efforts. Hopefully if he can treat himself up from the time constraints, he'll be a be more active and had a better/more consistent with loss.        No change in current medications besides timing of amlodipine  No orders of the defined types were placed in this encounter.   No orders of the defined types were placed in this encounter.    Followup: 12 months  DAVID W. Ellyn Hack, M.D., M.S. Interventional Cardiologist [facility]

## 2015-06-21 ENCOUNTER — Encounter: Payer: Self-pay | Admitting: Cardiology

## 2015-06-21 NOTE — Assessment & Plan Note (Signed)
Well-controlled on current medications. At this point since he is having a little bit of dizziness with medications, it is recommended he takes amlodipine with the Indocin carvedilol as opposed to in the morning when the combination of HCTZ and ACE inhibitor. Consider dropping amlodipine if his blood pressure continues to run in the 110s.

## 2015-06-21 NOTE — Assessment & Plan Note (Signed)
He is on statin, fenofibrate and 3 fatty acids. Labs were monitored by PCP. I don't have recent values.

## 2015-06-21 NOTE — Assessment & Plan Note (Signed)
He has been doing a pretty good job with steady weight loss. 7 pounds in the last year which followed an additional 5 pounds prior to that. He has been trying really hard to adjust his diet and increase his exercise level. I congratulated him on his efforts. Hopefully if he can treat himself up from the time constraints, he'll be a be more active and had a better/more consistent with loss.

## 2015-06-21 NOTE — Assessment & Plan Note (Signed)
Stable with no active anginal symptoms. Remains on aspirin, statin as well as beta blocker and ACE inhibitor. On calcium channel blocker for blood pressure as well as antianginal effect.

## 2015-06-24 DIAGNOSIS — L570 Actinic keratosis: Secondary | ICD-10-CM | POA: Diagnosis not present

## 2015-06-24 DIAGNOSIS — M7542 Impingement syndrome of left shoulder: Secondary | ICD-10-CM | POA: Diagnosis not present

## 2015-06-24 DIAGNOSIS — Z85828 Personal history of other malignant neoplasm of skin: Secondary | ICD-10-CM | POA: Diagnosis not present

## 2015-06-24 DIAGNOSIS — M7541 Impingement syndrome of right shoulder: Secondary | ICD-10-CM | POA: Diagnosis not present

## 2015-06-24 DIAGNOSIS — D239 Other benign neoplasm of skin, unspecified: Secondary | ICD-10-CM | POA: Diagnosis not present

## 2015-06-30 DIAGNOSIS — M7541 Impingement syndrome of right shoulder: Secondary | ICD-10-CM | POA: Diagnosis not present

## 2015-06-30 DIAGNOSIS — M7542 Impingement syndrome of left shoulder: Secondary | ICD-10-CM | POA: Diagnosis not present

## 2015-07-08 DIAGNOSIS — M7542 Impingement syndrome of left shoulder: Secondary | ICD-10-CM | POA: Diagnosis not present

## 2015-07-08 DIAGNOSIS — M7541 Impingement syndrome of right shoulder: Secondary | ICD-10-CM | POA: Diagnosis not present

## 2015-07-16 DIAGNOSIS — M7541 Impingement syndrome of right shoulder: Secondary | ICD-10-CM | POA: Diagnosis not present

## 2015-07-16 DIAGNOSIS — M7542 Impingement syndrome of left shoulder: Secondary | ICD-10-CM | POA: Diagnosis not present

## 2015-07-22 DIAGNOSIS — M7541 Impingement syndrome of right shoulder: Secondary | ICD-10-CM | POA: Diagnosis not present

## 2015-07-22 DIAGNOSIS — M7501 Adhesive capsulitis of right shoulder: Secondary | ICD-10-CM | POA: Diagnosis not present

## 2015-08-17 DIAGNOSIS — Z125 Encounter for screening for malignant neoplasm of prostate: Secondary | ICD-10-CM | POA: Diagnosis not present

## 2015-08-17 DIAGNOSIS — N183 Chronic kidney disease, stage 3 (moderate): Secondary | ICD-10-CM | POA: Diagnosis not present

## 2015-08-17 DIAGNOSIS — E039 Hypothyroidism, unspecified: Secondary | ICD-10-CM | POA: Diagnosis not present

## 2015-08-17 DIAGNOSIS — E785 Hyperlipidemia, unspecified: Secondary | ICD-10-CM | POA: Diagnosis not present

## 2015-08-17 DIAGNOSIS — E1129 Type 2 diabetes mellitus with other diabetic kidney complication: Secondary | ICD-10-CM | POA: Diagnosis not present

## 2015-08-24 DIAGNOSIS — K219 Gastro-esophageal reflux disease without esophagitis: Secondary | ICD-10-CM | POA: Diagnosis not present

## 2015-08-24 DIAGNOSIS — E039 Hypothyroidism, unspecified: Secondary | ICD-10-CM | POA: Diagnosis not present

## 2015-08-24 DIAGNOSIS — I739 Peripheral vascular disease, unspecified: Secondary | ICD-10-CM | POA: Diagnosis not present

## 2015-08-24 DIAGNOSIS — Z23 Encounter for immunization: Secondary | ICD-10-CM | POA: Diagnosis not present

## 2015-08-24 DIAGNOSIS — N183 Chronic kidney disease, stage 3 (moderate): Secondary | ICD-10-CM | POA: Diagnosis not present

## 2015-08-24 DIAGNOSIS — Z0000 Encounter for general adult medical examination without abnormal findings: Secondary | ICD-10-CM | POA: Diagnosis not present

## 2015-08-24 DIAGNOSIS — I10 Essential (primary) hypertension: Secondary | ICD-10-CM | POA: Diagnosis not present

## 2015-08-24 DIAGNOSIS — I2510 Atherosclerotic heart disease of native coronary artery without angina pectoris: Secondary | ICD-10-CM | POA: Diagnosis not present

## 2015-08-24 DIAGNOSIS — E1151 Type 2 diabetes mellitus with diabetic peripheral angiopathy without gangrene: Secondary | ICD-10-CM | POA: Diagnosis not present

## 2015-08-24 DIAGNOSIS — E784 Other hyperlipidemia: Secondary | ICD-10-CM | POA: Diagnosis not present

## 2015-08-24 DIAGNOSIS — Z1389 Encounter for screening for other disorder: Secondary | ICD-10-CM | POA: Diagnosis not present

## 2015-08-24 DIAGNOSIS — Z6833 Body mass index (BMI) 33.0-33.9, adult: Secondary | ICD-10-CM | POA: Diagnosis not present

## 2015-08-25 DIAGNOSIS — Z1212 Encounter for screening for malignant neoplasm of rectum: Secondary | ICD-10-CM | POA: Diagnosis not present

## 2015-08-26 ENCOUNTER — Other Ambulatory Visit: Payer: Self-pay | Admitting: Internal Medicine

## 2015-08-26 DIAGNOSIS — H52203 Unspecified astigmatism, bilateral: Secondary | ICD-10-CM | POA: Diagnosis not present

## 2015-08-26 DIAGNOSIS — H2512 Age-related nuclear cataract, left eye: Secondary | ICD-10-CM | POA: Diagnosis not present

## 2015-08-26 DIAGNOSIS — E119 Type 2 diabetes mellitus without complications: Secondary | ICD-10-CM | POA: Diagnosis not present

## 2015-08-26 DIAGNOSIS — N183 Chronic kidney disease, stage 3 unspecified: Secondary | ICD-10-CM

## 2015-08-26 DIAGNOSIS — H5010 Unspecified exotropia: Secondary | ICD-10-CM | POA: Diagnosis not present

## 2015-08-28 ENCOUNTER — Other Ambulatory Visit: Payer: Medicare Other

## 2015-08-31 ENCOUNTER — Ambulatory Visit
Admission: RE | Admit: 2015-08-31 | Discharge: 2015-08-31 | Disposition: A | Discharge status: Home / Self Care | Payer: Medicare Other | Source: Ambulatory Visit | Attending: Internal Medicine | Admitting: Internal Medicine

## 2015-08-31 DIAGNOSIS — N183 Chronic kidney disease, stage 3 unspecified: Secondary | ICD-10-CM

## 2015-09-16 DIAGNOSIS — R5383 Other fatigue: Secondary | ICD-10-CM | POA: Diagnosis not present

## 2015-09-16 DIAGNOSIS — R52 Pain, unspecified: Secondary | ICD-10-CM | POA: Diagnosis not present

## 2015-09-16 DIAGNOSIS — J069 Acute upper respiratory infection, unspecified: Secondary | ICD-10-CM | POA: Diagnosis not present

## 2015-09-20 ENCOUNTER — Encounter (HOSPITAL_BASED_OUTPATIENT_CLINIC_OR_DEPARTMENT_OTHER): Payer: Self-pay | Admitting: *Deleted

## 2015-09-20 ENCOUNTER — Emergency Department (HOSPITAL_BASED_OUTPATIENT_CLINIC_OR_DEPARTMENT_OTHER): Payer: Medicare Other

## 2015-09-20 ENCOUNTER — Emergency Department (HOSPITAL_BASED_OUTPATIENT_CLINIC_OR_DEPARTMENT_OTHER)
Admission: EM | Admit: 2015-09-20 | Discharge: 2015-09-20 | Disposition: A | Discharge status: Home / Self Care | Payer: Medicare Other | Attending: Emergency Medicine | Admitting: Emergency Medicine

## 2015-09-20 ENCOUNTER — Other Ambulatory Visit: Payer: Self-pay

## 2015-09-20 ENCOUNTER — Telehealth: Payer: Self-pay | Admitting: Nurse Practitioner

## 2015-09-20 DIAGNOSIS — I6789 Other cerebrovascular disease: Secondary | ICD-10-CM | POA: Diagnosis not present

## 2015-09-20 DIAGNOSIS — R29810 Facial weakness: Secondary | ICD-10-CM

## 2015-09-20 DIAGNOSIS — G510 Bell's palsy: Secondary | ICD-10-CM | POA: Insufficient documentation

## 2015-09-20 DIAGNOSIS — R51 Headache: Secondary | ICD-10-CM | POA: Diagnosis not present

## 2015-09-20 DIAGNOSIS — R05 Cough: Secondary | ICD-10-CM | POA: Diagnosis not present

## 2015-09-20 DIAGNOSIS — I440 Atrioventricular block, first degree: Secondary | ICD-10-CM | POA: Diagnosis not present

## 2015-09-20 DIAGNOSIS — R001 Bradycardia, unspecified: Secondary | ICD-10-CM | POA: Diagnosis not present

## 2015-09-20 HISTORY — DX: Cerebral infarction, unspecified: I63.9

## 2015-09-20 LAB — COMPREHENSIVE METABOLIC PANEL
ALBUMIN: 3.8 g/dL (ref 3.5–5.0)
ALT: 16 U/L — ABNORMAL LOW (ref 17–63)
ANION GAP: 8 (ref 5–15)
AST: 19 U/L (ref 15–41)
Alkaline Phosphatase: 26 U/L — ABNORMAL LOW (ref 38–126)
BILIRUBIN TOTAL: 0.6 mg/dL (ref 0.3–1.2)
BUN: 23 mg/dL — ABNORMAL HIGH (ref 6–20)
CALCIUM: 9.7 mg/dL (ref 8.9–10.3)
CO2: 24 mmol/L (ref 22–32)
Chloride: 104 mmol/L (ref 101–111)
Creatinine, Ser: 1.64 mg/dL — ABNORMAL HIGH (ref 0.61–1.24)
GFR calc non Af Amer: 41 mL/min — ABNORMAL LOW (ref >60)
GFR, EST AFRICAN AMERICAN: 48 mL/min — AB (ref >60)
GLUCOSE: 201 mg/dL — AB (ref 65–99)
POTASSIUM: 3.4 mmol/L — AB (ref 3.5–5.1)
SODIUM: 136 mmol/L (ref 135–145)
TOTAL PROTEIN: 7.2 g/dL (ref 6.5–8.1)

## 2015-09-20 LAB — DIFFERENTIAL
Basophils Absolute: 0 10*3/uL (ref 0.0–0.1)
Basophils Relative: 1 %
EOS ABS: 0.2 10*3/uL (ref 0.0–0.7)
EOS PCT: 3 %
LYMPHS ABS: 2.1 10*3/uL (ref 0.7–4.0)
Lymphocytes Relative: 28 %
MONO ABS: 0.6 10*3/uL (ref 0.1–1.0)
Monocytes Relative: 8 %
NEUTROS PCT: 60 %
Neutro Abs: 4.5 10*3/uL (ref 1.7–7.7)

## 2015-09-20 LAB — CBC
HCT: 38.6 % — ABNORMAL LOW (ref 39.0–52.0)
Hemoglobin: 13.1 g/dL (ref 13.0–17.0)
MCH: 29.8 pg (ref 26.0–34.0)
MCHC: 33.9 g/dL (ref 30.0–36.0)
MCV: 87.9 fL (ref 78.0–100.0)
PLATELETS: 218 10*3/uL (ref 150–400)
RBC: 4.39 MIL/uL (ref 4.22–5.81)
RDW: 13.9 % (ref 11.5–15.5)
WBC: 7.5 10*3/uL (ref 4.0–10.5)

## 2015-09-20 LAB — PROTIME-INR
INR: 1.06 (ref 0.00–1.49)
PROTHROMBIN TIME: 14 s (ref 11.6–15.2)

## 2015-09-20 LAB — TROPONIN I: Troponin I: <0.03 ng/mL (ref <0.031)

## 2015-09-20 LAB — APTT: aPTT: 31 seconds (ref 24–37)

## 2015-09-20 MED ORDER — ACYCLOVIR 400 MG PO TABS: 400.0000 mg | ORAL_TABLET | Freq: Every day | ORAL | Status: DC

## 2015-09-20 MED ORDER — ACYCLOVIR 400 MG PO TABS: 400.0000 mg | ORAL_TABLET | Freq: Every day | ORAL | Status: AC

## 2015-09-20 MED ORDER — ERYTHROMYCIN 5 MG/GM OP OINT: TOPICAL_OINTMENT | OPHTHALMIC | Status: DC

## 2015-09-20 MED ORDER — PREDNISONE 50 MG PO TABS
50.0000 mg | ORAL_TABLET | Freq: Every day | ORAL | Status: DC
Start: 2015-09-20 — End: 2015-09-20

## 2015-09-20 MED ORDER — PREDNISONE 50 MG PO TABS: 50.0000 mg | ORAL_TABLET | Freq: Every day | ORAL | Status: AC

## 2015-09-20 NOTE — ED Notes (Signed)
Pt reports he noticed some facial drooping on Friday; pt has hx of stroke in 2002 (denies being on blood thinners).

## 2015-09-20 NOTE — ED Notes (Signed)
Per EMS, pt being transferred to this facility from [facility] for MRI to rule out stroke. Pt presents with left side facial droop. Pt alert x4. NAD at this time.

## 2015-09-20 NOTE — ED Provider Notes (Signed)
CSN: [id number]     Arrival date & time 09/20/15  1434 History  By signing my name below, I, Erling Conte, attest that this documentation has been prepared under the direction and in the presence of Malvin Johns, MD. Electronically Signed: Erling Conte, ED Scribe. 09/20/2015. 4:47 PM.    Chief Complaint  Patient presents with  . Facial Droop   The history is provided by the patient. No language interpreter was used.    HPI Comments: Nicholas Hamilton is a 69 y.o. male with a h/o CAD, DM type II, HTN, and stroke with residual mild right leg weakness who presents to the Emergency Department complaining of a mild left sided facial droop onset 2 days ago that began to gradually worsen today. He reports associated watery discharge from his left eye, nasal congestion, fatigue, mild abdominal pain and episodic diarrhea. Pt notes he has a h/o stoke which occurred in 2002 with right sided deficits. Pt also endorses that he has been sick for over a week with sinus issues and cough/congestion. He takes 81mg  ASA daily but has not taken the medication today. He denies any aggravating/alleviating factors. Pt's mother has a h/o Bell's Palsy. He denies any left sided extremities numbness or weakness, HA, vision difficulty, slurred speech, hearing loss, or fever.  Past Medical History  Diagnosis Date  . CAD S/P percutaneous coronary angioplasty 2001    PCI to RCA - 2001 (Tetra BMS 3.0 mm x 23 mm)  - BMS; Cx 2003 - Cypher DES 2.5 mm x 28 mm; CAATH 2006 - 80% stenosis in prox RPL with L-R collaterals, too small for PCI - Med Rx  . History of stress test - Exercise Cardiolite 01/2005    mildly abnormal; 7-METS, peak HR 136 = 84% of max predicted; symptoms:significant dyspnea with exercise induced PVCs-ventricular bigeminy. nondiagnostic inferolateral ST-T changes.EF 47% = Non-obstructive CAD with the exception of 80% RPL stenosis on cath  . Diabetes mellitus type 2 with complications (HCC)     CAD  .  Hypertension, essential   . Hyperlipidemia LDL goal <70   . Hyperthyroidism   . GERD (gastroesophageal reflux disease)   . Stroke [facility])    Past Surgical History  Procedure Laterality Date  . Cardiac catheterization  02/10/2005    EF 50% Mild left ventricular dysfuntion with a stent ejection fraction in posterior basilar segment hypokinesis, widely patent stents in the circumflex, High-grade stenosis is a very small posterior lateral branch with a large left to right collateral bed in the same area.  . Coronary angioplasty with stent placement  04/29/2002    STENTS: A 2.5 x 28 cypher stents was placed in such a manner that both the proximal and distal portion of the obstruction in the circumflex vessel were well covered.The initial inflation was13 atmospheres for 60 seconds with final inflation being 15 atmospheres for 60 second. each inflation, the patient had burning in the back of his throat and chest pressure; it resolved with deflating the ballon  . Coronary angioplasty with stent placement  06/30/2000    STENTS: A tetra stent 3.0 x 23, the stent was placed in such a manner that the dissection was well covered and deployed at 9 atmospheres for 59 seconds with the second inflation being 10 atmospheres for 57 seconds. the stent appeared to be slightly hyperexpanded ompared to the remainder of the vessel and there was a mild stent down at the distal portion of the stent.  . Knee surgery Left 1983  Family History  Problem Relation Age of Onset  . Diabetes Mother   . Stroke Mother   . Bell's palsy Mother   . Heart attack Mother   . Stroke Father     x 3  . Heart attack Father   . Arthritis Sister    Social History  Substance Use Topics  . Smoking status: Former Smoker -- 1.00 packs/day for 40 years    Types: Cigarettes    Quit date: 10/24/2000  . Smokeless tobacco: Current User    Types: Chew  . Alcohol Use: Yes     Comment: Very seldom    Review of Systems  Constitutional:  Positive for fatigue. Negative for fever, chills and diaphoresis.  HENT: Positive for congestion and rhinorrhea. Negative for hearing loss and sneezing.   Eyes: Negative.   Respiratory: Negative for cough, chest tightness and shortness of breath.   Cardiovascular: Negative for chest pain and leg swelling.  Gastrointestinal: Positive for diarrhea. Negative for nausea, vomiting, abdominal pain and blood in stool.  Genitourinary: Negative for frequency, hematuria, flank pain and difficulty urinating.  Musculoskeletal: Negative for back pain and arthralgias.  Skin: Negative for rash.  Neurological: Positive for facial asymmetry (left sided). Negative for dizziness, speech difficulty, weakness, numbness and headaches.      Allergies  Penicillins  Home Medications   Prior to Admission medications   Medication Sig Start Date End Date Taking? Authorizing Provider  amLODipine (NORVASC) 2.5 MG tablet Take 2.5 mg by mouth daily.   Yes Historical Provider, MD  aspirin 81 MG tablet Take 81 mg by mouth daily.   Yes Historical Provider, MD  carvedilol (COREG) 25 MG tablet Take 25 mg by mouth 2 (two) times daily with a meal.   Yes Historical Provider, MD  fenofibrate micronized (LOFIBRA) 200 MG capsule Take 200 mg by mouth daily before breakfast.   Yes Historical Provider, MD  fish oil-omega-3 fatty acids 1000 MG capsule Take 1 g by mouth daily.   Yes Historical Provider, MD  glimepiride (AMARYL) 1 MG tablet Take 1 mg by mouth daily before breakfast.   Yes Historical Provider, MD  hydrochlorothiazide (HYDRODIURIL) 25 MG tablet Take 12.5 mg by mouth daily.   Yes Historical Provider, MD  levothyroxine (SYNTHROID, LEVOTHROID) 150 MCG tablet Take 150 mcg by mouth daily before breakfast.   Yes Historical Provider, MD  lisinopril (PRINIVIL,ZESTRIL) 40 MG tablet Take 40 mg by mouth daily.   Yes Historical Provider, MD  metFORMIN (GLUCOPHAGE) 1000 MG tablet Take 1,000 mg by mouth 2 (two) times daily with a  meal.   Yes Historical Provider, MD  nitroGLYCERIN (NITROSTAT) 0.4 MG SL tablet Place 1 tablet (0.4 mg total) under the tongue every 5 (five) minutes as needed for chest pain. 06/10/14  Yes Leonie Man, MD  omeprazole (PRILOSEC) 20 MG capsule Take 20 mg by mouth daily.   Yes Historical Provider, MD  simvastatin (ZOCOR) 40 MG tablet Take 40 mg by mouth every evening.   Yes Historical Provider, MD  INVOKANA 300 MG TABS Take 1 tablet by mouth daily. 04/19/13   Historical Provider, MD  naproxen (NAPROSYN) 250 MG tablet Take 250 mg by mouth 2 (two) times daily with a meal.    Historical Provider, MD   Triage Vitals: BP 112/61 mmHg  Pulse 59  Temp(Src) 97.9 F (36.6 C) (Oral)  Resp 18  Ht 5\' 9"  (1.753 m)  Wt 210 lb (95.255 kg)  BMI 31.00 kg/m2  SpO2 97%  Physical Exam  Constitutional: He is oriented to person, place, and time. He appears well-developed and well-nourished.  HENT:  Head: Normocephalic and atraumatic.  Mouth/Throat: Oropharynx is clear and moist.  Eyes: Pupils are equal, round, and reactive to light.  Neck: Normal range of motion. Neck supple.  Cardiovascular: Normal rate, regular rhythm and normal heart sounds.   Pulmonary/Chest: Effort normal and breath sounds normal. No respiratory distress. He has no wheezes. He has no rales. He exhibits no tenderness.  Abdominal: Soft. Bowel sounds are normal. There is no tenderness. There is no rebound and no guarding.  Musculoskeletal: Normal range of motion. He exhibits no edema.  Lymphadenopathy:    He has no cervical adenopathy.  Neurological: He is alert and oriented to person, place, and time.  Drooping of the corner of the left mouth.  Decreased strength on closure of left eye.  Decreased eyebrow lift on left.  Normal sensation to LT throughout face.  Motor 5/5 all ext.  Sensation grossly intact to LT all extremities.  No pronator drift, FTN intact.  Skin: Skin is warm and dry. No rash noted.  Psychiatric: He has a normal mood  and affect.    ED Course  Procedures (including critical care time)  DIAGNOSTIC STUDIES: Oxygen Saturation is 97% on RA, normal by my interpretation.    COORDINATION OF CARE: 3:13 PM- Will order Head CT w/o contrast, 12 lead EKG, and diagnostic lab work. Pt advised of plan for treatment and pt agrees.     Labs Review Labs Reviewed  CBC - Abnormal; Notable for the following:    HCT 38.6 (*)    All other components within normal limits  COMPREHENSIVE METABOLIC PANEL - Abnormal; Notable for the following:    Potassium 3.4 (*)    Glucose, Bld 201 (*)    BUN 23 (*)    Creatinine, Ser 1.64 (*)    ALT 16 (*)    Alkaline Phosphatase 26 (*)    GFR calc non Af Amer 41 (*)    GFR calc Af Amer 48 (*)    All other components within normal limits  PROTIME-INR  APTT  DIFFERENTIAL  TROPONIN I    Imaging Review Dg Chest 2 View  09/20/2015  CLINICAL DATA:  69 year old male with nonproductive cough for 1 week. History of hypertension and diabetes. EXAM: CHEST  2 VIEW COMPARISON:  Chest x-ray 02/04/2005. FINDINGS: Mild diffuse peribronchial cuffing. Lung volumes are normal. No consolidative airspace disease. No pleural effusions. No pneumothorax. No pulmonary nodule or mass noted. Pulmonary vasculature and the cardiomediastinal silhouette are within normal limits. Atherosclerosis in the thoracic aorta. IMPRESSION: 1. Mild diffuse peribronchial cuffing, suggestive of bronchitis. 2. Atherosclerosis. Electronically Signed   By: Vinnie Langton M.D.   On: 09/20/2015 16:32   Ct Head Wo Contrast  09/20/2015  CLINICAL DATA:  Headache, left eye blurred vision and left facial droop for 2 days. EXAM: CT HEAD WITHOUT CONTRAST TECHNIQUE: Contiguous axial images were obtained from the base of the skull through the vertex without intravenous contrast. COMPARISON:  None. FINDINGS: Ventricles, cisterns and other CSF spaces are within normal. No evidence of mass, mass effect, shift of midline structures or  acute hemorrhage. No evidence of acute infarct. Very minimal chronic ischemic microvascular disease. Possible old small lacunar infarct over the posterior right lentiform nucleus. Remaining bony and soft tissue structures are within normal. IMPRESSION: No acute intracranial findings. Minimal chronic ischemic microvascular disease. Electronically Signed   By: Marin Olp M.D.   On: 09/20/2015  15:25   I have personally reviewed and evaluated these images and lab results as part of my medical decision-making.   EKG Interpretation   Date/Time:  Sunday September 20 2015 15:05:00 EST Ventricular Rate:  54 PR Interval:  238 QRS Duration: 96 QT Interval:  428 QTC Calculation: 405 R Axis:   68 Text Interpretation:  Sinus bradycardia with 1st degree A-V block  Otherwise normal ECG since last tracing no significant change Confirmed by  Marley Pakula  MD, Gaelyn Tukes ([id number]) on 09/20/2015 3:58:22 PM      MDM   Final diagnoses:  Facial droop    PT presents with left side facial drooping.  No other neuro deficits.  I suspect Bell's Palsy, but with pt's other prior hx of CVA and risk factors, I feel that he needs an MR brain.  Will transfer to [facility] for this.  Pt accepted by Dr. Rex Kras.  Pt also has some URI symptoms that sound viral.  I personally performed the services described in this documentation, which was scribed in my presence.  The recorded information has been reviewed and considered.     Malvin Johns, MD 09/20/15 [phone]

## 2015-09-20 NOTE — Discharge Instructions (Signed)
Bell Palsy °Bell palsy is a condition in which the muscles on one side of the face become paralyzed. This often causes one side of the face to droop. It is a common condition and most people recover completely. °RISK FACTORS °Risk factors for Bell palsy include: °· Pregnancy. °· Diabetes. °· An infection by a virus, such as infections that cause cold sores. °CAUSES  °Bell palsy is caused by damage to or inflammation of a nerve in your face. It is unclear why this happens, but an infection by a virus may lead to it. Most of the time the reason it happens is unknown. °SIGNS AND SYMPTOMS  °Symptoms can range from mild to severe and can take place over a number of hours. Symptoms may include: °· Being unable to: °¨ Raise one or both eyebrows. °¨ Close one or both eyes. °¨ Feel parts of your face (facial numbness). °· Drooping of the eyelid and corner of the mouth. °· Weakness in the face. °· Paralysis of half your face. °· Loss of taste. °· Sensitivity to loud noises. °· Difficulty chewing. °· Tearing up of the affected eye. °· Dryness in the affected eye. °· Drooling. °· Pain behind one ear. °DIAGNOSIS  °Diagnosis of Bell palsy may include: °· A medical history and physical exam. °· An MRI. °· A CT scan. °· Electromyography (EMG). This is a test that checks how your nerves are working. °TREATMENT  °Treatment may include antiviral medicine to help shorten the length of the condition. Sometimes treatment is not needed and the symptoms go away on their own. °HOME CARE INSTRUCTIONS  °· Take medicines only as directed by your health care provider. °· Do facial massages and exercises as directed by your health care provider. °· If your eye is affected: °¨ Use moisturizing eye drops to prevent drying of your eye as directed by your health care provider. °¨ Protect your eye as directed by your health care provider. °SEEK MEDICAL CARE IF: °· Your symptoms do not get better or get worse. °· You are drooling. °· Your eye is red,  irritated, or hurts. °SEEK IMMEDIATE MEDICAL CARE IF:  °· Another part of your body feels weak or numb. °· You have difficulty swallowing. °· You have a fever along with symptoms of Bell palsy. °· You develop neck pain. °MAKE SURE YOU:  °· Understand these instructions. °· Will watch your condition. °· Will get help right away if you are not doing well or get worse. °  °This information is not intended to replace advice given to you by your health care provider. Make sure you discuss any questions you have with your health care provider. °  °Document Released: 10/10/2005 Document Revised: 07/01/2015 Document Reviewed: 01/17/2014 °Elsevier Interactive Patient Education ©2016 [employer] ° °

## 2015-09-20 NOTE — ED Notes (Signed)
Pt takes 81 mg ASA daily (has not taken yet today) and reports mild R-sided deficit from previous stroke.

## 2015-09-20 NOTE — Telephone Encounter (Signed)
   Pt called stating that he has been having cold-like Ss and congestion for about a week. He noticed yesterday that the left side of his face seemed to be drooping.  It is more pronounced this AM and his family has noted it as well.  He also has watering of the left eye.  He has no other focal deficits.  I rec that he present to the ED now for evaluation.     Caller verbalized understanding and was grateful for the call back.  He is going to the [place] @ [facility] for initial eval.  Murray Hodgkins, NP 09/20/2015, 2:32 PM

## 2015-09-20 NOTE — ED Provider Notes (Signed)
CSN: [id number]     Arrival date & time 09/20/15  1434 History   First MD Initiated Contact with Patient 09/20/15 1502     Chief Complaint  Patient presents with  . Facial Droop     (Consider location/radiation/quality/duration/timing/severity/associated sxs/prior Treatment) HPI  Patient is a 69 year old male with past medical history significant for hypertension, hyperlipidemia, hyperthyroidism, CVA (residual right lower extremity weakness), CAD, who presents to the emergency department with a left-sided facial droop. Patient reports that he first noticed a left-sided lower facial droop 2 days ago. It has been progressively worsening since that time. Associated with some drooling out of his left side of his mouth. Denies facial numbness or tingling. Denies facial rash. Denies headache, change in vision, difficulty swallowing, difficulty with speech, dizziness, extremity weakness or numbness. Denies recent head injury or falls. No history of Bell's palsy or zoster.   Past Medical History  Diagnosis Date  . CAD S/P percutaneous coronary angioplasty 2001    PCI to RCA - 2001 (Tetra BMS 3.0 mm x 23 mm)  - BMS; Cx 2003 - Cypher DES 2.5 mm x 28 mm; CAATH 2006 - 80% stenosis in prox RPL with L-R collaterals, too small for PCI - Med Rx  . History of stress test - Exercise Cardiolite 01/2005    mildly abnormal; 7-METS, peak HR 136 = 84% of max predicted; symptoms:significant dyspnea with exercise induced PVCs-ventricular bigeminy. nondiagnostic inferolateral ST-T changes.EF 47% = Non-obstructive CAD with the exception of 80% RPL stenosis on cath  . Diabetes mellitus type 2 with complications (HCC)     CAD  . Hypertension, essential   . Hyperlipidemia LDL goal <70   . Hyperthyroidism   . GERD (gastroesophageal reflux disease)   . Stroke [facility])    Past Surgical History  Procedure Laterality Date  . Cardiac catheterization  02/10/2005    EF 50% Mild left ventricular dysfuntion with a stent ejection  fraction in posterior basilar segment hypokinesis, widely patent stents in the circumflex, High-grade stenosis is a very small posterior lateral branch with a large left to right collateral bed in the same area.  . Coronary angioplasty with stent placement  04/29/2002    STENTS: A 2.5 x 28 cypher stents was placed in such a manner that both the proximal and distal portion of the obstruction in the circumflex vessel were well covered.The initial inflation was13 atmospheres for 60 seconds with final inflation being 15 atmospheres for 60 second. each inflation, the patient had burning in the back of his throat and chest pressure; it resolved with deflating the ballon  . Coronary angioplasty with stent placement  06/30/2000    STENTS: A tetra stent 3.0 x 23, the stent was placed in such a manner that the dissection was well covered and deployed at 9 atmospheres for 59 seconds with the second inflation being 10 atmospheres for 57 seconds. the stent appeared to be slightly hyperexpanded ompared to the remainder of the vessel and there was a mild stent down at the distal portion of the stent.  . Knee surgery Left 1983   Family History  Problem Relation Age of Onset  . Diabetes Mother   . Stroke Mother   . Bell's palsy Mother   . Heart attack Mother   . Stroke Father     x 3  . Heart attack Father   . Arthritis Sister    Social History  Substance Use Topics  . Smoking status: Former Smoker -- 1.00 packs/day for 40  years    Types: Cigarettes    Quit date: 10/24/2000  . Smokeless tobacco: Current User    Types: Chew  . Alcohol Use: Yes     Comment: Very seldom    Review of Systems  Constitutional: Negative for fever and appetite change.  HENT: Positive for drooling. Negative for congestion, facial swelling, trouble swallowing and voice change.   Eyes: Negative for visual disturbance.  Respiratory: Negative for chest tightness and shortness of breath.   Cardiovascular: Negative for chest pain  and palpitations.  Gastrointestinal: Negative for nausea, vomiting, abdominal pain and blood in stool.  Genitourinary: Negative for dysuria, hematuria and decreased urine volume.  Musculoskeletal: Negative for back pain and neck pain.  Skin: Negative for rash.  Neurological: Positive for facial asymmetry. Negative for dizziness, seizures, speech difficulty, weakness, light-headedness, numbness and headaches.  Psychiatric/Behavioral: Negative for behavioral problems.      Allergies  Penicillins  Home Medications   Prior to Admission medications   Medication Sig Start Date End Date Taking? Authorizing Provider  acetaminophen (TYLENOL) 500 MG tablet Take 500 mg by mouth 2 (two) times daily.   Yes Historical Provider, MD  amLODipine (NORVASC) 2.5 MG tablet Take 2.5 mg by mouth at bedtime.    Yes Historical Provider, MD  aspirin EC 325 MG tablet Take 650 mg by mouth daily as needed (pain).   Yes Historical Provider, MD  aspirin EC 81 MG tablet Take 81 mg by mouth at bedtime.   Yes Historical Provider, MD  carvedilol (COREG) 25 MG tablet Take 25 mg by mouth 2 (two) times daily with a meal.   Yes Historical Provider, MD  dapagliflozin propanediol (FARXIGA) 10 MG TABS tablet Take 10 mg by mouth daily after breakfast.   Yes Historical Provider, MD  fenofibrate micronized (LOFIBRA) 200 MG capsule Take 200 mg by mouth daily after breakfast.    Yes Historical Provider, MD  fish oil-omega-3 fatty acids 1000 MG capsule Take 1 g by mouth 2 (two) times daily.    Yes Historical Provider, MD  fluticasone (FLONASE) 50 MCG/ACT nasal spray Place 1 spray into both nostrils daily as needed (seasonal allergies).  08/21/15  Yes Historical Provider, MD  glimepiride (AMARYL) 2 MG tablet Take 2 mg by mouth daily before breakfast. 09/18/15  Yes Historical Provider, MD  hydrochlorothiazide (HYDRODIURIL) 25 MG tablet Take 12.5 mg by mouth daily.   Yes Historical Provider, MD  levothyroxine (SYNTHROID, LEVOTHROID) 175  MCG tablet Take 175 mcg by mouth See admin instructions. Take 1 tablet (175 mcg) by mouth Monday thru Saturday (skip Sundays) 07/20/15  Yes Historical Provider, MD  Liniments Elwin Mocha) PADS Apply 1 each topically daily as needed (pain).   Yes Historical Provider, MD  lisinopril (PRINIVIL,ZESTRIL) 40 MG tablet Take 40 mg by mouth daily.   Yes Historical Provider, MD  metFORMIN (GLUCOPHAGE) 1000 MG tablet Take 500-1,000 mg by mouth 2 (two) times daily with a meal. Take 1/2 tablet (500 mg) by mouth with breakfast and 1 tablet (1000 mg) with supper   Yes Historical Provider, MD  nitroGLYCERIN (NITROSTAT) 0.4 MG SL tablet Place 1 tablet (0.4 mg total) under the tongue every 5 (five) minutes as needed for chest pain. 06/10/14  Yes Leonie Man, MD  omeprazole (PRILOSEC) 20 MG capsule Take 20 mg by mouth daily.   Yes Historical Provider, MD  simvastatin (ZOCOR) 40 MG tablet Take 40 mg by mouth at bedtime.    Yes Historical Provider, MD  acyclovir (ZOVIRAX) 400 MG tablet  Take 1 tablet (400 mg total) by mouth 5 (five) times daily. 09/20/15 09/27/15  Nathaniel Man, MD  erythromycin ophthalmic ointment Place a 1/2 inch ribbon of ointment into the lower eyelid, tape left eye shut at night. 09/20/15   Nathaniel Man, MD  predniSONE (DELTASONE) 50 MG tablet Take 1 tablet (50 mg total) by mouth daily with breakfast. 09/20/15 09/27/15  Nathaniel Man, MD   BP 118/77 mmHg  Pulse 55  Temp(Src) 98 F (36.7 C) (Oral)  Resp 16  Ht 5\' 9"  (1.753 m)  Wt 95.255 kg  BMI 31.00 kg/m2  SpO2 98% Physical Exam  Constitutional: He is oriented to person, place, and time. He appears well-developed and well-nourished.  HENT:  Head: Normocephalic and atraumatic.  Right Ear: External ear normal.  Left Ear: External ear normal.  Mouth/Throat: Oropharynx is clear and moist.  Eyes: Conjunctivae and EOM are normal. Pupils are equal, round, and reactive to light.  Neck: Normal range of motion. No JVD present. No tracheal deviation  present.  Cardiovascular: Regular rhythm, normal heart sounds and intact distal pulses.   Pulmonary/Chest: Effort normal and breath sounds normal. No respiratory distress. He has no wheezes. He exhibits no tenderness.  Abdominal: Soft. He exhibits no distension. There is no tenderness. There is no rebound.  Musculoskeletal: Normal range of motion.  Neurological: He is alert and oriented to person, place, and time. He has normal strength and normal reflexes. He displays no tremor. No sensory deficit. He displays no seizure activity. Coordination and gait normal. GCS eye subscore is 4. GCS verbal subscore is 5. GCS motor subscore is 6. He displays no Babinski's sign on the right side. He displays no Babinski's sign on the left side.  Left-sided facial droop with forehead involvement. Fully able to close the left eye. Clear tearing of left eye  Skin: Skin is warm. No rash noted.  Psychiatric: He has a normal mood and affect.    ED Course  Procedures (including critical care time) Labs Review Labs Reviewed  CBC - Abnormal; Notable for the following:    HCT 38.6 (*)    All other components within normal limits  COMPREHENSIVE METABOLIC PANEL - Abnormal; Notable for the following:    Potassium 3.4 (*)    Glucose, Bld 201 (*)    BUN 23 (*)    Creatinine, Ser 1.64 (*)    ALT 16 (*)    Alkaline Phosphatase 26 (*)    GFR calc non Af Amer 41 (*)    GFR calc Af Amer 48 (*)    All other components within normal limits  PROTIME-INR  APTT  DIFFERENTIAL  TROPONIN I    Imaging Review Dg Chest 2 View  09/20/2015  CLINICAL DATA:  69 year old male with nonproductive cough for 1 week. History of hypertension and diabetes. EXAM: CHEST  2 VIEW COMPARISON:  Chest x-ray 02/04/2005. FINDINGS: Mild diffuse peribronchial cuffing. Lung volumes are normal. No consolidative airspace disease. No pleural effusions. No pneumothorax. No pulmonary nodule or mass noted. Pulmonary vasculature and the cardiomediastinal  silhouette are within normal limits. Atherosclerosis in the thoracic aorta. IMPRESSION: 1. Mild diffuse peribronchial cuffing, suggestive of bronchitis. 2. Atherosclerosis. Electronically Signed   By: Vinnie Langton M.D.   On: 09/20/2015 16:32   Ct Head Wo Contrast  09/20/2015  CLINICAL DATA:  Headache, left eye blurred vision and left facial droop for 2 days. EXAM: CT HEAD WITHOUT CONTRAST TECHNIQUE: Contiguous axial images were obtained from the base of the skull through  the vertex without intravenous contrast. COMPARISON:  None. FINDINGS: Ventricles, cisterns and other CSF spaces are within normal. No evidence of mass, mass effect, shift of midline structures or acute hemorrhage. No evidence of acute infarct. Very minimal chronic ischemic microvascular disease. Possible old small lacunar infarct over the posterior right lentiform nucleus. Remaining bony and soft tissue structures are within normal. IMPRESSION: No acute intracranial findings. Minimal chronic ischemic microvascular disease. Electronically Signed   By: Marin Olp M.D.   On: 09/20/2015 15:25   I have personally reviewed and evaluated these images and lab results as part of my medical decision-making.   EKG Interpretation   Date/Time:  Sunday September 20 2015 15:05:00 EST Ventricular Rate:  54 PR Interval:  238 QRS Duration: 96 QT Interval:  428 QTC Calculation: 405 R Axis:   68 Text Interpretation:  Sinus bradycardia with 1st degree A-V block  Otherwise normal ECG since last tracing no significant change Confirmed by  BELFI  MD, MELANIE ([id number]) on 09/20/2015 3:58:22 PM      MDM   Final diagnoses:  Facial droop  Bell's palsy   Patient is a 69 year old male with past medical history significant for hypertension, hyperlipidemia, hyperthyroidism, CVA (residual right lower extremity weakness), who presents to the emergency department with left-sided facial droop. Onset of symptoms 3 days ago. No other associated  neurologic deficits. No history of herpes zoster or Bell's palsy in the past. On arrival no acute distress, not ill appearing. Afebrile, hemodynamically stable. Exam as above, notable for left-sided facial droop without forehead sparing, no other neurologic deficits.  Patient most likely with Bell's palsy. CT head showed no acute findings. Labs including CBC, CMP, troponin, coags unremarkable. Patient with chronic kidney disease, Cr 1.64. Given that the patient has no other neurologic deficits and has had involvement of his left-sided facial droop, most likely peripheral seventh nerve palsy.   Patient discharged home with a prescription for acyclovir, prednisone, erythromycin ophthalmic ointment. Discussed with the patient that he should keep his left eye covered at nighttime in order to prevent corneal abrasions. Discussed follow-up with the primary care doctor in the next week. Discussed strict return precautions for worsening of symptoms or other neurologic deficits. Patient expressed understanding, stable for discharge home, stable vital signs at time of discharge. No questions or concerns at time of discharge.  Nathaniel Man, MD 09/21/15 0140  Blanchie Dessert, MD 09/22/15 2149

## 2015-09-29 DIAGNOSIS — E1151 Type 2 diabetes mellitus with diabetic peripheral angiopathy without gangrene: Secondary | ICD-10-CM | POA: Diagnosis not present

## 2015-09-29 DIAGNOSIS — I10 Essential (primary) hypertension: Secondary | ICD-10-CM | POA: Diagnosis not present

## 2015-09-29 DIAGNOSIS — Z6834 Body mass index (BMI) 34.0-34.9, adult: Secondary | ICD-10-CM | POA: Diagnosis not present

## 2015-09-29 DIAGNOSIS — Z8669 Personal history of other diseases of the nervous system and sense organs: Secondary | ICD-10-CM | POA: Diagnosis not present

## 2015-12-29 DIAGNOSIS — E668 Other obesity: Secondary | ICD-10-CM | POA: Diagnosis not present

## 2015-12-29 DIAGNOSIS — I7389 Other specified peripheral vascular diseases: Secondary | ICD-10-CM | POA: Diagnosis not present

## 2015-12-29 DIAGNOSIS — E1151 Type 2 diabetes mellitus with diabetic peripheral angiopathy without gangrene: Secondary | ICD-10-CM | POA: Diagnosis not present

## 2015-12-29 DIAGNOSIS — Z6833 Body mass index (BMI) 33.0-33.9, adult: Secondary | ICD-10-CM | POA: Diagnosis not present

## 2015-12-29 DIAGNOSIS — I10 Essential (primary) hypertension: Secondary | ICD-10-CM | POA: Diagnosis not present

## 2016-04-05 DIAGNOSIS — I2510 Atherosclerotic heart disease of native coronary artery without angina pectoris: Secondary | ICD-10-CM | POA: Diagnosis not present

## 2016-04-05 DIAGNOSIS — E784 Other hyperlipidemia: Secondary | ICD-10-CM | POA: Diagnosis not present

## 2016-04-05 DIAGNOSIS — I10 Essential (primary) hypertension: Secondary | ICD-10-CM | POA: Diagnosis not present

## 2016-04-05 DIAGNOSIS — E1151 Type 2 diabetes mellitus with diabetic peripheral angiopathy without gangrene: Secondary | ICD-10-CM | POA: Diagnosis not present

## 2016-04-05 DIAGNOSIS — E038 Other specified hypothyroidism: Secondary | ICD-10-CM | POA: Diagnosis not present

## 2016-04-05 DIAGNOSIS — Z6832 Body mass index (BMI) 32.0-32.9, adult: Secondary | ICD-10-CM | POA: Diagnosis not present

## 2016-04-05 DIAGNOSIS — I7389 Other specified peripheral vascular diseases: Secondary | ICD-10-CM | POA: Diagnosis not present

## 2016-06-06 DIAGNOSIS — Z6831 Body mass index (BMI) 31.0-31.9, adult: Secondary | ICD-10-CM | POA: Diagnosis not present

## 2016-06-06 DIAGNOSIS — I10 Essential (primary) hypertension: Secondary | ICD-10-CM | POA: Diagnosis not present

## 2016-07-05 ENCOUNTER — Ambulatory Visit (INDEPENDENT_AMBULATORY_CARE_PROVIDER_SITE_OTHER): Payer: Medicare Other | Admitting: Cardiology

## 2016-07-05 ENCOUNTER — Encounter: Payer: Self-pay | Admitting: Cardiology

## 2016-07-05 VITALS — BP 92/54 | HR 54 | Ht 68.5 in | Wt 213.0 lb

## 2016-07-05 DIAGNOSIS — E785 Hyperlipidemia, unspecified: Secondary | ICD-10-CM

## 2016-07-05 DIAGNOSIS — I2510 Atherosclerotic heart disease of native coronary artery without angina pectoris: Secondary | ICD-10-CM

## 2016-07-05 DIAGNOSIS — Z9861 Coronary angioplasty status: Secondary | ICD-10-CM | POA: Diagnosis not present

## 2016-07-05 DIAGNOSIS — I10 Essential (primary) hypertension: Secondary | ICD-10-CM | POA: Diagnosis not present

## 2016-07-05 DIAGNOSIS — E669 Obesity, unspecified: Secondary | ICD-10-CM | POA: Diagnosis not present

## 2016-07-05 NOTE — Patient Instructions (Signed)
Medications:  --Do no take amlodipine tonight; your blood pressure is running low today. Also do not take if feeling dizzy.   Other Instructions:  --Please have your primary doctor send your labs in October.   Follow-Up:  Your physician wants you to follow-up in: 1 year with Dr. Ellyn Hack. You will receive a reminder letter in the mail two months in advance. If you don't receive a letter, please call our office to schedule the follow-up appointment. If you need a refill on your cardiac medications before your next appointment, please call your pharmacy.

## 2016-07-05 NOTE — Progress Notes (Signed)
PCP: Donnajean Lopes, MD  Clinic Note: Chief Complaint  Patient presents with  . Follow-up    no chest pain, occassioal pain in feet-diabetic  . Coronary Artery Disease  Problem List: 1. CAD S/P percutaneous coronary angioplasty   2. Essential hypertension   3. Hyperlipidemia with target LDL less than 70   4. Obesity (BMI 30-39.9)     HPI: Nicholas Hamilton is a 70 y.o. male with a Cardiovascular Problem List below who presents today for his coronary disease status post PCI to RCA & Cx.  His "anginal equivalent" was exertional dyspnea with hot and cold intolerance.  Usually goes to [facility] to exercise 3 d /wk - just started back.  Mother died in 02/17/23 - lots of other responsibilities.[name]  He was last seen in August 2016. Was doing well without any major complaints. Trying to stay active.  Was seen in ER on 09/20/2015 420 symptoms of left-sided facial droop. Thought to be related to Bell's palsy. No recent studies or hospitalizations.  Interval History: He presents today for annual followup without any major complaints.   He is trying to stay active but has not been exercising as well and has also not been paying attention to his diet because of time constraints.He indicates he is still trying to exercise, but has been out of sync recently with other responsibilities.  He denies any resting or exertional dyspnea or chest tightness/angina.   He does note occasionally feeling like he has no energy. No hot or cold intolerance.  No PND, orthopnea or edema.  No palpitations, weakness or syncope/near syncope.  However he does occasionally get a little lightheaded in the mornings after getting his medications. Not any syncopal episodes. No TIA/amaurosis fugax symptoms. No claudication.  He is due to have labs checked by his PCP in September.  Past Medical History:  Diagnosis Date  . CAD S/P percutaneous coronary angioplasty February 17, 2000   PCI to RCA - 02/17/2000 (Tetra BMS 3.0 mm x 23 mm)  - BMS;  Cx 02/16/2002 - Cypher DES 2.5 mm x 28 mm; CAATH 2005-02-16 - 80% stenosis in prox RPL with L-R collaterals, too small for PCI - Med Rx  . Diabetes mellitus type 2 with complications (HCC)    CAD  . GERD (gastroesophageal reflux disease)   . History of stress test - Exercise Cardiolite 2005-02-16   mildly abnormal; 7-METS, peak HR 136 = 84% of max predicted; symptoms:significant dyspnea with exercise induced PVCs-ventricular bigeminy. nondiagnostic inferolateral ST-T changes.EF 47% = Non-obstructive CAD with the exception of 80% RPL stenosis on cath  . Hyperlipidemia LDL goal <70   . Hypertension, essential   . Hyperthyroidism   . Stroke ([place])     ROS: A comprehensive Review of Systems - was performed Review of Systems  Constitutional: Positive for weight loss (Intentional). Negative for chills, fever and malaise/fatigue.       No hot or cold intolerance  HENT: Negative for nosebleeds.   Respiratory: Negative for cough, shortness of breath and wheezing.   Gastrointestinal: Negative for blood in stool, heartburn and melena.  Genitourinary: Negative for hematuria.  Musculoskeletal: Positive for joint pain (Bilateral Shouder bursitis). Negative for myalgias.       Bilateral but left greater than right knee pain. Has been seen by orthopedic surgery; trying to avoid surgery.  Skin: Negative for rash.  Neurological: Negative for headaches.       He also notes bilateral diabetic foot pain - probably related to neuropathy  Endo/Heme/Allergies:  Does not bruise/bleed easily.  Psychiatric/Behavioral: Negative for depression and memory loss. The patient is not nervous/anxious and does not have insomnia.   All other systems reviewed and are negative.   Past Surgical History:  Procedure Laterality Date  . CARDIAC CATHETERIZATION  02/10/2005   EF 50% Mild left ventricular dysfuntion with a stent ejection fraction in posterior basilar segment hypokinesis, widely patent stents in the circumflex, High-grade stenosis  is a very small posterior lateral branch with a large left to right collateral bed in the same area.  . CORONARY ANGIOPLASTY WITH STENT PLACEMENT  04/29/2002   STENTS: A 2.5 x 28 cypher stents was placed in such a manner that both the proximal and distal portion of the obstruction in the circumflex vessel were well covered.The initial inflation was13 atmospheres for 60 seconds with final inflation being 15 atmospheres for 60 second. each inflation, the patient had burning in the back of his throat and chest pressure; it resolved with deflating the ballon  . CORONARY ANGIOPLASTY WITH STENT PLACEMENT  06/30/2000   STENTS: A tetra stent 3.0 x 23, the stent was placed in such a manner that the dissection was well covered and deployed at 9 atmospheres for 59 seconds with the second inflation being 10 atmospheres for 57 seconds. the stent appeared to be slightly hyperexpanded ompared to the remainder of the vessel and there was a mild stent down at the distal portion of the stent.  [name] KNEE SURGERY Left 1983    Prior to Admission medications   Medication Sig Start Date Taking? Authorizing Provider  acetaminophen (TYLENOL) 500 MG tablet Take 500 mg by mouth 2 (two) times daily.  Yes Historical Provider, MD  amLODipine (NORVASC) 2.5 MG tablet Take 2.5 mg by mouth at bedtime.   Yes Historical Provider, MD  aspirin EC 325 MG tablet Take 650 mg by mouth daily as needed (pain).  Yes Historical Provider, MD  aspirin EC 81 MG tablet Take 81 mg by mouth at bedtime.  Yes Historical Provider, MD  carvedilol (COREG) 25 MG tablet Take 25 mg by mouth 2 (two) times daily with a meal.  Yes Historical Provider, MD  dapagliflozin propanediol (FARXIGA) 10 MG TABS tablet Take 10 mg by mouth daily after breakfast.  Yes Historical Provider, MD  fenofibrate micronized (LOFIBRA) 200 MG capsule Take 200 mg by mouth daily after breakfast.   Yes Historical Provider, MD  fish oil-omega-3 fatty acids 1000 MG capsule Take 1 g by mouth 2  (two) times daily.   Yes Historical Provider, MD  fluticasone (FLONASE) 50 MCG/ACT nasal spray Place 1 spray into both nostrils daily as needed (seasonal allergies).  08/21/15 Yes Historical Provider, MD  glimepiride (AMARYL) 2 MG tablet Take 2 mg by mouth daily before breakfast. 09/18/15 Yes Historical Provider, MD  hydrochlorothiazide (HYDRODIURIL) 25 MG tablet Take 12.5 mg by mouth daily.  Yes Historical Provider, MD  levothyroxine (SYNTHROID, LEVOTHROID) 175 MCG tablet Take 175 mcg by mouth See admin instructions. Take 1 tablet (175 mcg) by mouth Monday thru Saturday (skip Sundays) 07/20/15 Yes Historical Provider, MD  Liniments Elwin Mocha) PADS Apply 1 each topically daily as needed (pain).  Yes Historical Provider, MD  lisinopril (PRINIVIL,ZESTRIL) 40 MG tablet Take 40 mg by mouth daily.  Yes Historical Provider, MD  metFORMIN (GLUCOPHAGE) 1000 MG tablet Take 500-1,000 mg by mouth 2 (two) times daily with a meal. Take 1/2 tablet (500 mg) by mouth with breakfast and 1 tablet (1000 mg) with supper  Yes Historical Provider,  MD  nitroGLYCERIN (NITROSTAT) 0.4 MG SL tablet Place 1 tablet (0.4 mg total) under the tongue every 5 (five) minutes as needed for chest pain. 06/10/14 Yes Leonie Man, MD  omeprazole (PRILOSEC) 20 MG capsule Take 20 mg by mouth daily.  Yes Historical Provider, MD  simvastatin (ZOCOR) 40 MG tablet Take 40 mg by mouth at bedtime.   Yes Historical Provider, MD     Allergies  Allergen Reactions  . Penicillins Other (See Comments)    Excessive sweating Has patient had a PCN reaction causing immediate rash, facial/tongue/throat swelling, SOB or lightheadedness with hypotension: No Has patient had a PCN reaction causing severe rash involving mucus membranes or skin necrosis: No Has patient had a PCN reaction that required hospitalization No Has patient had a PCN reaction occurring within the last 10 years: No If all of the above answers are "NO", then may proceed with  Cephalosporin use.    Family History  Problem Relation Age of Onset  . Diabetes Mother   . Stroke Mother   . Bell's palsy Mother   . Heart attack Mother   . Stroke Father     x 3  . Heart attack Father   . Arthritis Sister    Social History  Substance Use Topics  . Smoking status: Former Smoker    Packs/day: 1.00    Years: 40.00    Types: Cigarettes    Quit date: 10/24/2000  . Smokeless tobacco: Current User    Types: Chew  . Alcohol use Yes     Comment: Very seldom    Wt Readings from Last 3 Encounters:  07/05/16 213 lb (96.6 kg)  09/20/15 210 lb (95.3 kg)  06/19/15 213 lb (96.6 kg)  was down to 202 on home scale  PHYSICAL EXAM BP (!) 92/54 (BP Location: Right Arm)   Pulse (!) 54   Ht 5' 8.5" (1.74 m)   Wt 213 lb (96.6 kg)   BMI 31.92 kg/m  General appearance: alert, cooperative, appears stated age, mild distress and mildly obese  HEENT: Dante/AT, EOMI, MMM, anicteric sclera Neck: faint R carotid bruit; no LAD or JVD; supple, symmetrical, trachea midline and thyroid not enlarged, symmetric, no tenderness/mass/nodules  Lungs: clear to auscultation bilaterally, normal percussion bilaterally and non-labored, good air movement  Heart: regular rate and rhythm, S1, S2 normal, no murmur, click, rub or gallop and normal apical impulse  Abdomen: soft, non-tender; bowel sounds normal; no masses, no organomegaly and mild-moderate truncal obesity  Extremities: extremities normal, atraumatic, no cyanosis or edema, no edema, redness or tenderness in the calves or thighs and no ulcers, gangrene or trophic changes  Pulses: 2+ and symmetric  Neurologic: Grossly normal; normal mood and affect    Adult ECG Report  Rate: 54 ;  Rhythm: sinus bradycardia and 1 AV block; otherwise normal EKG -. Stable EKG  Recent Labs not available. Monitored by PCP.: Due to be checked in September  ASSESSMENT / PLAN: Problem List Items Addressed This Visit    Obesity (BMI 30-39.9) (Chronic)    He  sort of plateaued on his weight loss now. He had been doing very well up until then. We discussed dietary modification and increasing exercise.  I think part of the reason why his weight is back up is because he has been less active with exercise.      Hyperlipidemia with target LDL less than 70 (Chronic)    He is on statin, fenofibrate and omega-3 fatty acids. Labs are being  monitored by his PCP -unfortunately they're not yet available.      Essential hypertension (Chronic)    His blood pressures actually little bit low today. I've asked that he hold his amlodipine tonight. Aspirin he doesn't notice any symptoms associated with it. I told him if he does have some dizziness, I would simply hold the Norvasc. He is on a high-dose of lisinopril, this could be reduced to 20 mg as there is not much benefit from 20 versus 40. He is also what half dose HCTZ and full strength carvedilol.  Plan: Hold amlodipine for dizziness. If pressures continue to be low, would simply to stop amlodipine.      Relevant Orders   EKG 12-Lead (Completed)   CAD S/P PCI to RCA - 2001; Cx 2003 - Primary (Chronic)    Doing well. No active anginal symptoms. Trying to stay active.  On aspirin, statin, beta blocker and ACE inhibitor. Also on calcium channel blocker for blood pressure and antianginal effect.       Other Visit Diagnoses   None.    No change in current medications besides timing of amlodipine - hold tonight  Orders Placed This Encounter  Procedures  . EKG 12-Lead    Order Specific Question:   Where should this test be performed    Answer:   Other   No orders of the defined types were placed in this encounter.   Followup: 12 months    Glenetta Hew, M.D., M.S. Interventional Cardiologist   Pager # [phone] Phone # [phone] [address] [place], [place] [address]

## 2016-07-06 DIAGNOSIS — D239 Other benign neoplasm of skin, unspecified: Secondary | ICD-10-CM | POA: Diagnosis not present

## 2016-07-06 DIAGNOSIS — B079 Viral wart, unspecified: Secondary | ICD-10-CM | POA: Diagnosis not present

## 2016-07-06 DIAGNOSIS — L570 Actinic keratosis: Secondary | ICD-10-CM | POA: Diagnosis not present

## 2016-07-07 ENCOUNTER — Encounter: Payer: Self-pay | Admitting: Cardiology

## 2016-07-07 NOTE — Assessment & Plan Note (Signed)
His blood pressures actually little bit low today. I've asked that he hold his amlodipine tonight. Aspirin he doesn't notice any symptoms associated with it. I told him if he does have some dizziness, I would simply hold the Norvasc. He is on a high-dose of lisinopril, this could be reduced to 20 mg as there is not much benefit from 20 versus 40. He is also what half dose HCTZ and full strength carvedilol.  Plan: Hold amlodipine for dizziness. If pressures continue to be low, would simply to stop amlodipine.

## 2016-07-07 NOTE — Assessment & Plan Note (Signed)
He sort of plateaued on his weight loss now. He had been doing very well up until then. We discussed dietary modification and increasing exercise.  I think part of the reason why his weight is back up is because he has been less active with exercise.

## 2016-07-07 NOTE — Assessment & Plan Note (Signed)
Doing well. No active anginal symptoms. Trying to stay active.  On aspirin, statin, beta blocker and ACE inhibitor. Also on calcium channel blocker for blood pressure and antianginal effect.

## 2016-07-07 NOTE — Assessment & Plan Note (Signed)
He is on statin, fenofibrate and omega-3 fatty acids. Labs are being monitored by his PCP -unfortunately they're not yet available.

## 2016-08-18 DIAGNOSIS — E1151 Type 2 diabetes mellitus with diabetic peripheral angiopathy without gangrene: Secondary | ICD-10-CM | POA: Diagnosis not present

## 2016-08-18 DIAGNOSIS — N183 Chronic kidney disease, stage 3 (moderate): Secondary | ICD-10-CM | POA: Diagnosis not present

## 2016-08-18 DIAGNOSIS — E784 Other hyperlipidemia: Secondary | ICD-10-CM | POA: Diagnosis not present

## 2016-08-18 DIAGNOSIS — Z125 Encounter for screening for malignant neoplasm of prostate: Secondary | ICD-10-CM | POA: Diagnosis not present

## 2016-08-18 DIAGNOSIS — E038 Other specified hypothyroidism: Secondary | ICD-10-CM | POA: Diagnosis not present

## 2016-08-25 DIAGNOSIS — I2510 Atherosclerotic heart disease of native coronary artery without angina pectoris: Secondary | ICD-10-CM | POA: Diagnosis not present

## 2016-08-25 DIAGNOSIS — Z1389 Encounter for screening for other disorder: Secondary | ICD-10-CM | POA: Diagnosis not present

## 2016-08-25 DIAGNOSIS — E1140 Type 2 diabetes mellitus with diabetic neuropathy, unspecified: Secondary | ICD-10-CM | POA: Diagnosis not present

## 2016-08-25 DIAGNOSIS — E784 Other hyperlipidemia: Secondary | ICD-10-CM | POA: Diagnosis not present

## 2016-08-25 DIAGNOSIS — E1129 Type 2 diabetes mellitus with other diabetic kidney complication: Secondary | ICD-10-CM | POA: Diagnosis not present

## 2016-08-25 DIAGNOSIS — E038 Other specified hypothyroidism: Secondary | ICD-10-CM | POA: Diagnosis not present

## 2016-08-25 DIAGNOSIS — I10 Essential (primary) hypertension: Secondary | ICD-10-CM | POA: Diagnosis not present

## 2016-08-25 DIAGNOSIS — Z0000 Encounter for general adult medical examination without abnormal findings: Secondary | ICD-10-CM | POA: Diagnosis not present

## 2016-08-25 DIAGNOSIS — Z6832 Body mass index (BMI) 32.0-32.9, adult: Secondary | ICD-10-CM | POA: Diagnosis not present

## 2016-08-26 DIAGNOSIS — Z1212 Encounter for screening for malignant neoplasm of rectum: Secondary | ICD-10-CM | POA: Diagnosis not present

## 2016-08-31 DIAGNOSIS — H40011 Open angle with borderline findings, low risk, right eye: Secondary | ICD-10-CM | POA: Diagnosis not present

## 2016-08-31 DIAGNOSIS — E119 Type 2 diabetes mellitus without complications: Secondary | ICD-10-CM | POA: Diagnosis not present

## 2016-08-31 DIAGNOSIS — Z0100 Encounter for examination of eyes and vision without abnormal findings: Secondary | ICD-10-CM | POA: Diagnosis not present

## 2016-08-31 DIAGNOSIS — H40012 Open angle with borderline findings, low risk, left eye: Secondary | ICD-10-CM | POA: Diagnosis not present

## 2016-09-29 DIAGNOSIS — H26491 Other secondary cataract, right eye: Secondary | ICD-10-CM | POA: Diagnosis not present

## 2016-12-01 DIAGNOSIS — I10 Essential (primary) hypertension: Secondary | ICD-10-CM | POA: Diagnosis not present

## 2016-12-01 DIAGNOSIS — E1140 Type 2 diabetes mellitus with diabetic neuropathy, unspecified: Secondary | ICD-10-CM | POA: Diagnosis not present

## 2016-12-01 DIAGNOSIS — Z6832 Body mass index (BMI) 32.0-32.9, adult: Secondary | ICD-10-CM | POA: Diagnosis not present

## 2016-12-01 DIAGNOSIS — I2510 Atherosclerotic heart disease of native coronary artery without angina pectoris: Secondary | ICD-10-CM | POA: Diagnosis not present

## 2016-12-01 DIAGNOSIS — E1151 Type 2 diabetes mellitus with diabetic peripheral angiopathy without gangrene: Secondary | ICD-10-CM | POA: Diagnosis not present

## 2016-12-01 DIAGNOSIS — Z1389 Encounter for screening for other disorder: Secondary | ICD-10-CM | POA: Diagnosis not present

## 2016-12-01 DIAGNOSIS — J310 Chronic rhinitis: Secondary | ICD-10-CM | POA: Diagnosis not present

## 2017-03-21 ENCOUNTER — Ambulatory Visit (INDEPENDENT_AMBULATORY_CARE_PROVIDER_SITE_OTHER): Payer: Medicare Other

## 2017-03-21 ENCOUNTER — Encounter: Payer: Self-pay | Admitting: Sports Medicine

## 2017-03-21 ENCOUNTER — Ambulatory Visit (INDEPENDENT_AMBULATORY_CARE_PROVIDER_SITE_OTHER): Payer: Medicare Other | Admitting: Sports Medicine

## 2017-03-21 VITALS — BP 122/78 | HR 57 | Ht 68.5 in | Wt 209.2 lb

## 2017-03-21 DIAGNOSIS — M5126 Other intervertebral disc displacement, lumbar region: Secondary | ICD-10-CM | POA: Diagnosis not present

## 2017-03-21 DIAGNOSIS — E669 Obesity, unspecified: Secondary | ICD-10-CM | POA: Diagnosis not present

## 2017-03-21 DIAGNOSIS — M25551 Pain in right hip: Secondary | ICD-10-CM

## 2017-03-21 DIAGNOSIS — M47816 Spondylosis without myelopathy or radiculopathy, lumbar region: Secondary | ICD-10-CM

## 2017-03-21 DIAGNOSIS — I10 Essential (primary) hypertension: Secondary | ICD-10-CM | POA: Diagnosis not present

## 2017-03-21 MED ORDER — GABAPENTIN 300 MG PO CAPS: ORAL_CAPSULE | ORAL | 1 refills | Status: DC

## 2017-03-21 NOTE — Progress Notes (Signed)
OFFICE VISIT NOTE Nicholas Hamilton. Nicholas Hamilton, [facility]  Nicholas Hamilton - 71 y.o. male MRN [id number]  Date of birth: 08/18/1946  Visit Date: 03/21/2017  PCP: Nicholas Battles, MD   Referred by: Nicholas Battles, MD  Nicholas Hamilton, CMA acting as scribe for Dr. Paulla Fore.  SUBJECTIVE:   Chief Complaint  Patient presents with  . pain in right hip   HPI: As below and per problem based documentation when appropriate.  Pt presents today with complaint of right hip pain. Pain is located close to the buttock. Pt c/o pain in right hip that started 3-4 years ago but seems to be getting progressively worse. He has trouble sleeping at night because he has pain with he lays on either side so he sleeps flat on his back. Pt reports that 30 years ago he fell from a machine and landed on his right hip. He denies any other injury or trauma. He does workout at the [facility] and reports no pain during exercises.   The pain is described as mild discomfort at times but severe aching at other times. Pain is rated as 3/10 currently and 9/10 when at its worst. Pt has stabbing pain when at its worst and causes him to be unable to walk. The pain seems to come and go.   Pt reports that pain seems random, there doesn't seem to be any specific trigger. Nothing seems to make the pain better because it is random, it seems to come and go suddenly.  Therapies tried include Acetaminophen, he gets some relief with this.   Other associated symptoms include: Pt has no associated pains. Pt does report chronic bilateral knee pain.   Pt reports not recent xray of the hip.   Pt denies fever, chills, night sweats, weight gain or weight loss.     Review of Systems  Constitutional: Negative for chills, fever and weight loss.  Respiratory: Negative for shortness of breath and wheezing.   Cardiovascular: Negative for chest pain, palpitations and leg swelling.    Musculoskeletal: Positive for joint pain. Negative for falls.  Neurological: Positive for tingling (in toes, pt does have DMII) and headaches (occasionally). Negative for dizziness.  Endo/Heme/Allergies: Does not bruise/bleed easily.    Otherwise per HPI.  HISTORY & PERTINENT PRIOR DATA:  No specialty comments available. He reports that he quit smoking about 16 years ago. His smoking use included Cigarettes. He has a 40.00 pack-year smoking history. His smokeless tobacco use includes Chew. No results for input(s): HGBA1C, LABURIC in the last 8760 hours. Medications & Allergies reviewed per EMR Patient Active Problem List   Diagnosis Date Noted  . Lumbar spondylosis 03/25/2017  . Obesity (BMI 30-39.9) 05/10/2013  . Essential hypertension   . Hyperlipidemia with target LDL less than 70   . CAD S/P PCI to RCA - 2001; Cx 2003 05/10/2002   Past Medical History:  Diagnosis Date  . CAD S/P percutaneous coronary angioplasty 2001   PCI to RCA - 2001 (Tetra BMS 3.0 mm x 23 mm)  - BMS; Cx 2003 - Cypher DES 2.5 mm x 28 mm; CAATH 2006 - 80% stenosis in prox RPL with L-R collaterals, too small for PCI - Med Rx  . Diabetes mellitus type 2 with complications (HCC)    CAD  . GERD (gastroesophageal reflux disease)   . History of stress test - Exercise Cardiolite 01/2005   mildly abnormal; 7-METS, peak HR 136 = 84%  of max predicted; symptoms:significant dyspnea with exercise induced PVCs-ventricular bigeminy. nondiagnostic inferolateral ST-T changes.EF 47% = Non-obstructive CAD with the exception of 80% RPL stenosis on cath  . Hyperlipidemia LDL goal <70   . Hypertension, essential   . Hyperthyroidism   . Stroke [facility])    Family History  Problem Relation Age of Onset  . Diabetes Mother   . Stroke Mother   . Bell's palsy Mother   . Heart attack Mother   . Stroke Father        x 3  . Heart attack Father   . Arthritis Sister    Past Surgical History:  Procedure Laterality Date  . CARDIAC  CATHETERIZATION  02/10/2005   EF 50% Mild left ventricular dysfuntion with a stent ejection fraction in posterior basilar segment hypokinesis, widely patent stents in the circumflex, High-grade stenosis is a very small posterior lateral branch with a large left to right collateral bed in the same area.  . CORONARY ANGIOPLASTY WITH STENT PLACEMENT  04/29/2002   STENTS: A 2.5 x 28 cypher stents was placed in such a manner that both the proximal and distal portion of the obstruction in the circumflex vessel were well covered.The initial inflation was13 atmospheres for 60 seconds with final inflation being 15 atmospheres for 60 second. each inflation, the patient had burning in the back of his throat and chest pressure; it resolved with deflating the ballon  . CORONARY ANGIOPLASTY WITH STENT PLACEMENT  06/30/2000   STENTS: A tetra stent 3.0 x 23, the stent was placed in such a manner that the dissection was well covered and deployed at 9 atmospheres for 59 seconds with the second inflation being 10 atmospheres for 57 seconds. the stent appeared to be slightly hyperexpanded ompared to the remainder of the vessel and there was a mild stent down at the distal portion of the stent.  [name] KNEE SURGERY Left 1983   Social History   Occupational History  . Not on file.   Social History Main Topics  . Smoking status: Former Smoker    Packs/day: 1.00    Years: 40.00    Types: Cigarettes    Quit date: 10/24/2000  . Smokeless tobacco: Current User    Types: Chew  . Alcohol use Yes     Comment: Very seldom  . Drug use: No  . Sexual activity: Not on file    OBJECTIVE:  VS:  HT:5' 8.5" (174 cm)   WT:209 lb 3.2 oz (94.9 kg)  BMI:31.4    BP:122/78  HR:(!) 57bpm  TEMP: ( )  RESP:94 % EXAM: Findings:  WDWN, NAD, Non-toxic appearing Alert & appropriately interactive Not depressed or anxious appearing No increased work of breathing. Pupils are equal. EOM intact without nystagmus No clubbing or cyanosis  of the extremities appreciated No significant rashes/lesions/ulcerations overlying the examined area. DP and PT pulses 1+/4 bilaterally.  Trace pitting edema bilaterally.  Sensation intact to light touch in lower extremities.  Back & Lower Extremities: Tightness with bilateral straight leg raise but no radicular component. No significant midline tenderness.   TTP with popliteal compression test as well as compression of the greater sciatic notch on the right, negative on the left for both. Good internal and external rotation of the hips. Manual muscle testing is 5+/5 in BLE myotomes without focality Lower extremity DTRs 1+/4 diffusely and symmetric      Dg Lumbar Spine 2-3 Views  Result Date: 03/21/2017 CLINICAL DATA:  A. hip pain worsening over the past  3-4 years. No recent trauma. EXAM: LUMBAR SPINE - 2-3 VIEW COMPARISON:  None in PACs FINDINGS: The lumbar vertebral bodies are preserved in height. There is mild disc space narrowing at L2-3 with moderate narrowing at L3-4 and L4-5. There is facet joint hypertrophy at L4-5 and L5-S1. There is no spondylolisthesis. The pedicles and transverse processes are intact. There is mural calcification in the abdominal aorta and common iliac arteries. IMPRESSION: Multilevel degenerative disc disease as described. No compression fracture or spondylolisthesis. Electronically Signed   By: David  Martinique M.D.   On: 03/21/2017 16:47   Dg Hip Unilat W Or W/o Pelvis 2-3 Views Right  Result Date: 03/21/2017 CLINICAL DATA:  3-4 years of right hip pain with progressive worsening of symptoms. No recent trauma. EXAM: DG HIP (WITH OR WITHOUT PELVIS) 2-3V RIGHT COMPARISON:  None in PACs FINDINGS: The bony pelvis is subjectively adequately mineralized. AP and lateral views of the right hip reveal reasonable preservation of the joint space for age. The articular surfaces of the femoral head and acetabulum remains smoothly rounded. The femoral neck, intertrochanteric, and  subtrochanteric regions are normal. IMPRESSION: There is no acute or significant chronic bony abnormality of the right hip. Minimal age-appropriate symmetric joint space loss of both hips is observed. Electronically Signed   By: David  Martinique M.D.   On: 03/21/2017 16:49   ASSESSMENT & PLAN:   Problem List Items Addressed This Visit    Essential hypertension (Chronic)    Patient would like to avoid any type of anti-inflammatories due to his underlying hypertension.  We also discussed the use of corticosteroids but he would like to defer at this time.  He does take aspirin fairly well without significant GI upset so could consider therapeutic dosing.        Obesity (BMI 30-39.9) (Chronic)    Comp gating features of his lumbar spondylosis.  We did discuss that it would be helpful to lose weight      Lumbar spondylosis    He is likely having lumbar spondylosis with radiculitis that is causing his right hip pain.  He really has minimal degenerative change and I suspect the buttock pain that he is having is referred. Core conditioning program reviewed in detail with athletic training staff under my direct supervision. Trial of gabapentin.  If any lack of improvement consider further diagnostic evaluation with MRI.  +++++++++++++++++++++++++++++++++++++++++++++++++++++++++++++++ PROCEDURE NOTE: THERAPEUTIC EXERCISES (97110) 15 minutes spent for Therapeutic exercises as stated in above notes.  This included exercises focusing on stretching, strengthening, with significant focus on eccentric aspects.   Proper technique shown and discussed handout in great detail with ATC.  All questions were discussed and answered.        Relevant Medications   gabapentin (NEURONTIN) 300 MG capsule    Other Visit Diagnoses    Right hip pain    -  Primary   Relevant Orders   DG Lumbar Spine 2-3 Views (Completed)   DG HIP UNILAT W OR W/O PELVIS 2-3 VIEWS RIGHT (Completed)      Follow-up: Return in about 6  weeks (around 05/02/2017).   CMA/ATC served as Education administrator during this visit. History, Physical, and Plan performed by medical provider. Documentation and orders reviewed and attested to.      Teresa Coombs, [facility] Physician

## 2017-03-21 NOTE — Patient Instructions (Signed)
Please perform the exercise program that Jeneen Rinks has prepared for you and gone over in detail on a daily basis.  In addition to the handout you were provided you can access your program through: [email or web address]   Your unique program code is: [email or web address]

## 2017-03-25 DIAGNOSIS — M47816 Spondylosis without myelopathy or radiculopathy, lumbar region: Secondary | ICD-10-CM | POA: Insufficient documentation

## 2017-03-25 NOTE — Assessment & Plan Note (Signed)
Comp gating features of his lumbar spondylosis.  We did discuss that it would be helpful to lose weight

## 2017-03-25 NOTE — Assessment & Plan Note (Signed)
He is likely having lumbar spondylosis with radiculitis that is causing his right hip pain.  He really has minimal degenerative change and I suspect the buttock pain that he is having is referred. Core conditioning program reviewed in detail with athletic training staff under my direct supervision. Trial of gabapentin.  If any lack of improvement consider further diagnostic evaluation with MRI.  +++++++++++++++++++++++++++++++++++++++++++++++++++++++++++++++ PROCEDURE NOTE: THERAPEUTIC EXERCISES (97110) 15 minutes spent for Therapeutic exercises as stated in above notes.  This included exercises focusing on stretching, strengthening, with significant focus on eccentric aspects.   Proper technique shown and discussed handout in great detail with ATC.  All questions were discussed and answered.

## 2017-03-25 NOTE — Assessment & Plan Note (Addendum)
Patient would like to avoid any type of anti-inflammatories due to his underlying hypertension.  We also discussed the use of corticosteroids but he would like to defer at this time.  He does take aspirin fairly well without significant GI upset so could consider therapeutic dosing.

## 2017-03-28 DIAGNOSIS — E784 Other hyperlipidemia: Secondary | ICD-10-CM | POA: Diagnosis not present

## 2017-03-28 DIAGNOSIS — Z6832 Body mass index (BMI) 32.0-32.9, adult: Secondary | ICD-10-CM | POA: Diagnosis not present

## 2017-03-28 DIAGNOSIS — M545 Low back pain: Secondary | ICD-10-CM | POA: Diagnosis not present

## 2017-03-28 DIAGNOSIS — I2510 Atherosclerotic heart disease of native coronary artery without angina pectoris: Secondary | ICD-10-CM | POA: Diagnosis not present

## 2017-03-28 DIAGNOSIS — I7389 Other specified peripheral vascular diseases: Secondary | ICD-10-CM | POA: Diagnosis not present

## 2017-03-28 DIAGNOSIS — I10 Essential (primary) hypertension: Secondary | ICD-10-CM | POA: Diagnosis not present

## 2017-03-28 DIAGNOSIS — E1151 Type 2 diabetes mellitus with diabetic peripheral angiopathy without gangrene: Secondary | ICD-10-CM | POA: Diagnosis not present

## 2017-05-01 ENCOUNTER — Ambulatory Visit (INDEPENDENT_AMBULATORY_CARE_PROVIDER_SITE_OTHER): Payer: Medicare Other | Admitting: Sports Medicine

## 2017-05-01 ENCOUNTER — Encounter: Payer: Self-pay | Admitting: Sports Medicine

## 2017-05-01 VITALS — BP 124/72 | HR 60 | Ht 68.5 in | Wt 213.6 lb

## 2017-05-01 DIAGNOSIS — M47816 Spondylosis without myelopathy or radiculopathy, lumbar region: Secondary | ICD-10-CM

## 2017-05-01 DIAGNOSIS — Z9861 Coronary angioplasty status: Secondary | ICD-10-CM | POA: Diagnosis not present

## 2017-05-01 DIAGNOSIS — S29011A Strain of muscle and tendon of front wall of thorax, initial encounter: Secondary | ICD-10-CM

## 2017-05-01 DIAGNOSIS — I2510 Atherosclerotic heart disease of native coronary artery without angina pectoris: Secondary | ICD-10-CM

## 2017-05-01 NOTE — Progress Notes (Signed)
OFFICE VISIT NOTE Nicholas Hamilton. [name], [place] [facility]  Nicholas Hamilton - 71 y.o. male MRN [id number]  Date of birth: 05-Mar-1946  Visit Date: 05/01/2017  PCP: Leanna Battles, MD   Referred by: Leanna Battles, MD  Burlene Arnt, CMA acting as scribe for Dr. Paulla Fore.  SUBJECTIVE:   Chief Complaint  Patient presents with  . left sided chest pain, possible pulled muscle   HPI: As below and per problem based documentation when appropriate.  Pt presents today with complaint of left sided chest pain, possible muscle strain. Pain started 1-2 weeks ago after tripping over a wire and hitting the left side (rib area) on a table. He noticed some swelling at the time of the injury. No didn't notice any bruising. No xray of the ribs done. Pain is described as shooting pain when bending forward. The pain is worse when sleeping/laying on the left side. No pain when taking a deep breath, coughing or laughing. He has some pain toward the bottom and proximal aspect of the pectoralis. He has noticed improvement of symptoms over the past couple of days. He used Biofreeze on the area before bedtime and may have gotten a little relief. He has not tried Ibuprofen, ice, or heating pad.   Pt reports that he did wake up with some stiffness in his neck this morning.   Pt denies SOB, chest heaviness, increased anxiety, increased sweating, palor, visual changes, weakness in extremities. Pt denies fever, chills, night sweats.     Review of Systems  Constitutional: Negative for chills and fever.  Respiratory: Negative for shortness of breath.   Cardiovascular: Positive for chest pain. Negative for palpitations and leg swelling.  Musculoskeletal: Positive for falls and neck pain.  Neurological: Negative for dizziness, tingling and headaches.  Endo/Heme/Allergies: Does not bruise/bleed easily.    Otherwise per HPI.  HISTORY & PERTINENT PRIOR DATA:   No specialty comments available. He reports that he quit smoking about 16 years ago. His smoking use included Cigarettes. He has a 40.00 pack-year smoking history. His smokeless tobacco use includes Chew. No results for input(s): HGBA1C, LABURIC in the last 8760 hours. Medications & Allergies reviewed per EMR Patient Active Problem List   Diagnosis Date Noted  . Strain of left pectoralis muscle 05/01/2017  . Lumbar spondylosis 03/25/2017  . Obesity (BMI 30-39.9) 05/10/2013  . Essential hypertension   . Hyperlipidemia with target LDL less than 70   . CAD S/P PCI to RCA - 2001; Cx 2003 05/10/2002   Past Medical History:  Diagnosis Date  . CAD S/P percutaneous coronary angioplasty 2001   PCI to RCA - 2001 (Tetra BMS 3.0 mm x 23 mm)  - BMS; Cx 2003 - Cypher DES 2.5 mm x 28 mm; CAATH 2006 - 80% stenosis in prox RPL with L-R collaterals, too small for PCI - Med Rx  . Diabetes mellitus type 2 with complications (HCC)    CAD  . GERD (gastroesophageal reflux disease)   . History of stress test - Exercise Cardiolite 01/2005   mildly abnormal; 7-METS, peak HR 136 = 84% of max predicted; symptoms:significant dyspnea with exercise induced PVCs-ventricular bigeminy. nondiagnostic inferolateral ST-T changes.EF 47% = Non-obstructive CAD with the exception of 80% RPL stenosis on cath  . Hyperlipidemia LDL goal <70   . Hypertension, essential   . Hyperthyroidism   . Stroke [facility])    Family History  Problem Relation Age of Onset  . Diabetes  Mother   . Stroke Mother   . Bell's palsy Mother   . Heart attack Mother   . Stroke Father        x 3  . Heart attack Father   . Arthritis Sister    Past Surgical History:  Procedure Laterality Date  . CARDIAC CATHETERIZATION  02/10/2005   EF 50% Mild left ventricular dysfuntion with a stent ejection fraction in posterior basilar segment hypokinesis, widely patent stents in the circumflex, High-grade stenosis is a very small posterior lateral branch with a  large left to right collateral bed in the same area.  . CORONARY ANGIOPLASTY WITH STENT PLACEMENT  04/29/2002   STENTS: A 2.5 x 28 cypher stents was placed in such a manner that both the proximal and distal portion of the obstruction in the circumflex vessel were well covered.The initial inflation was13 atmospheres for 60 seconds with final inflation being 15 atmospheres for 60 second. each inflation, the patient had burning in the back of his throat and chest pressure; it resolved with deflating the ballon  . CORONARY ANGIOPLASTY WITH STENT PLACEMENT  06/30/2000   STENTS: A tetra stent 3.0 x 23, the stent was placed in such a manner that the dissection was well covered and deployed at 9 atmospheres for 59 seconds with the second inflation being 10 atmospheres for 57 seconds. the stent appeared to be slightly hyperexpanded ompared to the remainder of the vessel and there was a mild stent down at the distal portion of the stent.  [name] KNEE SURGERY Left 1983   Social History   Occupational History  . Not on file.   Social History Main Topics  . Smoking status: Former Smoker    Packs/day: 1.00    Years: 40.00    Types: Cigarettes    Quit date: 10/24/2000  . Smokeless tobacco: Current User    Types: Chew  . Alcohol use Yes     Comment: Very seldom  . Drug use: No  . Sexual activity: Not on file    OBJECTIVE:  VS:  HT:5' 8.5" (174 cm)   WT:213 lb 9.6 oz (96.9 kg)  BMI:32.1    BP:124/72  HR:60bpm  TEMP: ( )  RESP:94 % EXAM: Findings:  WDWN, NAD, Non-toxic appearing Alert & appropriately interactive Not depressed or anxious appearing No increased work of breathing. Pupils are equal. EOM intact without nystagmus No clubbing or cyanosis of the extremities appreciated No significant rashes/lesions/ulcerations overlying the examined area. DP & PT pulses 2+/4.  No significant pretibial edema. Sensation intact to light touch in lower extremities.  Bilateral lower extremities overall well  aligned.  No significant neural tension signs.  Good internal/external rotation of bilateral hips.  Chest wall is overall well aligned no significant bruising or ecchymosis.  He does have reproducible tenderness to palpation along the inferior margin of ribs 7 as well as with the inferior portion of the pectoralis major.  Pain is worse with resisted elbow adduction but rotator cuff strength is intact.  Full overhead range of motion of shoulders and neck.     No results found. ASSESSMENT & PLAN:   Problem List Items Addressed This Visit    CAD S/P PCI to RCA - 2001; Cx 2003 - Primary (Chronic)    Unable to take anti-inflammatories.  Okay to take Tylenol as needed for this and continue working on therapeutic exercises.      Lumbar spondylosis    Overall significantly improved.  He has been performing exercises at  the gym and we will have him continue working with machines first lumbar extension as well as lumbar rotation.  To me with other home therapeutic exercises per [facility] spine conditioning program.  If any lack of improvement can consider further evaluation but overall he is improved quite well.      Strain of left pectoralis muscle    Patient has reproducible pain on palpation and prior traumatic fall resulted in muscular strain of the intercostals as well as inferior fibers of the pectoralis major.  He has overall significantly improved over the past several days and we will continue with conservative management at this time.  He should continue working on gentle stretching as well as range of motion without over exertion.  There is no exertional component to his chest discomfort/pain however we discussed red flags for needing to be reevaluated from a cardiac standpoint.  Patient voices understanding and will follow-up as needed.         Follow-up: Return if symptoms worsen or fail to improve.  No future appointments.   CMA/ATC served as Education administrator during this visit. History, Physical, and  Plan performed by medical provider. Documentation and orders reviewed and attested to.      Teresa Coombs, [facility] Physician

## 2017-05-01 NOTE — Assessment & Plan Note (Signed)
Unable to take anti-inflammatories.  Okay to take Tylenol as needed for this and continue working on therapeutic exercises.

## 2017-05-01 NOTE — Assessment & Plan Note (Signed)
Overall significantly improved.  He has been performing exercises at the gym and we will have him continue working with machines first lumbar extension as well as lumbar rotation.  To me with other home therapeutic exercises per [facility] spine conditioning program.  If any lack of improvement can consider further evaluation but overall he is improved quite well.

## 2017-05-01 NOTE — Assessment & Plan Note (Signed)
Patient has reproducible pain on palpation and prior traumatic fall resulted in muscular strain of the intercostals as well as inferior fibers of the pectoralis major.  He has overall significantly improved over the past several days and we will continue with conservative management at this time.  He should continue working on gentle stretching as well as range of motion without over exertion.  There is no exertional component to his chest discomfort/pain however we discussed red flags for needing to be reevaluated from a cardiac standpoint.  Patient voices understanding and will follow-up as needed.

## 2017-05-02 ENCOUNTER — Ambulatory Visit: Payer: Medicare Other | Admitting: Sports Medicine

## 2017-07-06 ENCOUNTER — Ambulatory Visit (INDEPENDENT_AMBULATORY_CARE_PROVIDER_SITE_OTHER): Payer: Medicare Other | Admitting: Cardiology

## 2017-07-06 ENCOUNTER — Encounter: Payer: Self-pay | Admitting: Cardiology

## 2017-07-06 VITALS — BP 114/50 | HR 55 | Ht 68.5 in | Wt 207.0 lb

## 2017-07-06 DIAGNOSIS — E669 Obesity, unspecified: Secondary | ICD-10-CM

## 2017-07-06 DIAGNOSIS — Z9861 Coronary angioplasty status: Secondary | ICD-10-CM

## 2017-07-06 DIAGNOSIS — I10 Essential (primary) hypertension: Secondary | ICD-10-CM | POA: Diagnosis not present

## 2017-07-06 DIAGNOSIS — R001 Bradycardia, unspecified: Secondary | ICD-10-CM | POA: Insufficient documentation

## 2017-07-06 DIAGNOSIS — I2510 Atherosclerotic heart disease of native coronary artery without angina pectoris: Secondary | ICD-10-CM | POA: Diagnosis not present

## 2017-07-06 DIAGNOSIS — E785 Hyperlipidemia, unspecified: Secondary | ICD-10-CM | POA: Diagnosis not present

## 2017-07-06 MED ORDER — CARVEDILOL 12.5 MG PO TABS: 12.5000 mg | ORAL_TABLET | Freq: Two times a day (BID) | ORAL | 11 refills | Status: DC

## 2017-07-06 NOTE — Progress Notes (Signed)
PCP: Leanna Battles, MD  Clinic Note: Chief Complaint  Patient presents with  . Follow-up    1 year.  . Coronary Artery Disease    HPI: Nicholas Hamilton is a 71 y.o. male with a PMH below who presents today for Annual follow-up for CAD-PCI to the RCA and circumflex. His anginal equivalent was exertional dyspnea with hot and cold intolerance. He usually exercises at the [facility] 3 days a week.  Nicholas Hamilton was last seen on 07/10/2016 -- he was doing well without any major complaints. Had a hard time getting into doing exercise because of other time constraints. .  Recent Hospitalizations: none  Studies Personally Reviewed - (if available, images/films reviewed: From Epic Chart or Care Everywhere)  none  Interval History: Nicholas Hamilton presents today doing well. He still goes to the [facility] to do his exercise. But he has noted however though is usually does his exercise, he just doesn't have the energy he used to have. He is now only able to do 1 mile as opposed to going further. He stops and does the stationary bicycle or other exercise options --> Overall less "get up and go ". He denies any dyspnea or chest discomfort associated with these episodes. He just notes that it's hard to get going. Once he gets going he feels okay. No heart failure symptoms of PND or orthopnea, but does have some mild pedal edema at the end of the day. He may have a few skipped beats here and there, but denies any rapid irregular heartbeats or palpitations.  No palpitations, lightheadedness, dizziness, weakness or syncope/near syncope. No TIA/amaurosis fugax symptoms. No melena, hematochezia, hematuria, or epstaxis. No claudication.  ROS: A comprehensive was performed. Review of Systems  Constitutional: Positive for malaise/fatigue (less E than usual). Negative for diaphoresis and fever.  HENT: Negative for nosebleeds.   Respiratory: Positive for cough (not much - no issue with breath) and shortness of  breath. Negative for wheezing.   Cardiovascular: Negative.   Gastrointestinal: Negative for abdominal pain, blood in stool and melena.  Genitourinary: Negative for hematuria.  Musculoskeletal: Negative for falls, joint pain (has meniscal tear) and neck pain.       L leg hurts & may feel weak - 2/2 knee pain  Neurological: Positive for headaches (not many & not close together). Negative for loss of consciousness and weakness.       Both feet feel somewhat numb with rare "shock" like Sx. In feet.  End of day - they ache.  Endo/Heme/Allergies: Does not bruise/bleed easily (if he hits something, does bleed easily ).  Psychiatric/Behavioral: Negative for depression and memory loss. The patient is not nervous/anxious.   All other systems reviewed and are negative.  Had Flu several months ago -- took ~2 + weeks to got better & longer to recover  I have reviewed and (if needed) personally updated the patient's problem list, medications, allergies, past medical and surgical history, social and family history.   Past Medical History:  Diagnosis Date  . CAD S/P percutaneous coronary angioplasty 2001   PCI to RCA - 2001 (Tetra BMS 3.0 mm x 23 mm)  - BMS; Cx 2003 - Cypher DES 2.5 mm x 28 mm; CAATH 2006 - 80% stenosis in prox RPL with L-R collaterals, too small for PCI - Med Rx  . Diabetes mellitus type 2 with complications (HCC)    CAD  . GERD (gastroesophageal reflux disease)   . History of stress test - Exercise Cardiolite  01/2005   mildly abnormal; 7-METS, peak HR 136 = 84% of max predicted; symptoms:significant dyspnea with exercise induced PVCs-ventricular bigeminy. nondiagnostic inferolateral ST-T changes.EF 47% = Non-obstructive CAD with the exception of 80% RPL stenosis on cath  . Hyperlipidemia LDL goal <70   . Hypertension, essential   . Hyperthyroidism   . Stroke [facility])     Past Surgical History:  Procedure Laterality Date  . CARDIAC CATHETERIZATION  02/10/2005   EF 50% Mild left  ventricular dysfuntion with a stent ejection fraction in posterior basilar segment hypokinesis, widely patent stents in the circumflex, High-grade stenosis is a very small posterior lateral branch with a large left to right collateral bed in the same area.  . CORONARY ANGIOPLASTY WITH STENT PLACEMENT  04/29/2002   STENTS: A 2.5 x 28 cypher stents was placed in such a manner that both the proximal and distal portion of the obstruction in the circumflex vessel were well covered.The initial inflation was13 atmospheres for 60 seconds with final inflation being 15 atmospheres for 60 second. each inflation, the patient had burning in the back of his throat and chest pressure; it resolved with deflating the ballon  . CORONARY ANGIOPLASTY WITH STENT PLACEMENT  06/30/2000   STENTS: A tetra stent 3.0 x 23, the stent was placed in such a manner that the dissection was well covered and deployed at 9 atmospheres for 59 seconds with the second inflation being 10 atmospheres for 57 seconds. the stent appeared to be slightly hyperexpanded ompared to the remainder of the vessel and there was a mild stent down at the distal portion of the stent.  Nicholas Hamilton KNEE SURGERY Left 1983    Current Meds  Medication Sig  . acetaminophen (TYLENOL) 500 MG tablet Take 500 mg by mouth 2 (two) times daily.  Nicholas Hamilton amLODipine (NORVASC) 2.5 MG tablet Take 2.5 mg by mouth at bedtime.   Nicholas Hamilton aspirin EC 325 MG tablet Take 650 mg by mouth daily as needed (pain).  Nicholas Hamilton aspirin EC 81 MG tablet Take 81 mg by mouth at bedtime.  . dapagliflozin propanediol (FARXIGA) 10 MG TABS tablet Take 10 mg by mouth daily after breakfast.  . fenofibrate micronized (LOFIBRA) 200 MG capsule Take 200 mg by mouth daily after breakfast.   . fish oil-omega-3 fatty acids 1000 MG capsule Take 1 g by mouth 2 (two) times daily.   . fluticasone (FLONASE) 50 MCG/ACT nasal spray Place 1 spray into both nostrils daily as needed (seasonal allergies).   . gabapentin (NEURONTIN) 300 MG  capsule Start with 1 tab po qhs X 1 week, then increase to 1 tab po bid X 1 week then 1 tab po tid prn  . glimepiride (AMARYL) 2 MG tablet Take 2 mg by mouth daily before breakfast.  . hydrochlorothiazide (HYDRODIURIL) 25 MG tablet Take 12.5 mg by mouth daily.  Nicholas Hamilton levothyroxine (SYNTHROID, LEVOTHROID) 175 MCG tablet Take 175 mcg by mouth See admin instructions. Take 1 tablet (175 mcg) by mouth Monday thru Saturday (skip Sundays)  . Liniments (SALONPAS) PADS Apply 1 each topically daily as needed (pain).  Nicholas Hamilton lisinopril (PRINIVIL,ZESTRIL) 40 MG tablet Take 40 mg by mouth daily.  . metFORMIN (GLUCOPHAGE) 1000 MG tablet Take 500-1,000 mg by mouth 2 (two) times daily with a meal. Take 1/2 tablet (500 mg) by mouth with breakfast and 1 tablet (1000 mg) with supper  . nitroGLYCERIN (NITROSTAT) 0.4 MG SL tablet Place 1 tablet (0.4 mg total) under the tongue every 5 (five) minutes as needed for chest  pain.  . omeprazole (PRILOSEC) 20 MG capsule Take 20 mg by mouth daily.  . simvastatin (ZOCOR) 40 MG tablet Take 40 mg by mouth at bedtime.   . [DISCONTINUED] carvedilol (COREG) 25 MG tablet Take 25 mg by mouth 2 (two) times daily with a meal.    Allergies  Allergen Reactions  . Penicillins Other (See Comments)    Excessive sweating Has patient had a PCN reaction causing immediate rash, facial/tongue/throat swelling, SOB or lightheadedness with hypotension: No Has patient had a PCN reaction causing severe rash involving mucus membranes or skin necrosis: No Has patient had a PCN reaction that required hospitalization No Has patient had a PCN reaction occurring within the last 10 years: No If all of the above answers are "NO", then may proceed with Cephalosporin use.    Social History   Social History  . Marital status: Married    Spouse name: N/A  . Number of children: N/A  . Years of education: N/A   Social History Main Topics  . Smoking status: Former Smoker    Packs/day: 1.00    Years: 40.00     Types: Cigarettes    Quit date: 10/24/2000  . Smokeless tobacco: Current User    Types: Chew  . Alcohol use Yes     Comment: Very seldom  . Drug use: No  . Sexual activity: Not Asked   Other Topics Concern  . None   Social History Narrative   : Married, 1 child. Chews tobacco, rare alcohol[facility]. Talks about   wanting to lose weight.  Per Dr. Rex Kras, he needs to continue his exercise and diet and stop snacking between meals.     family history includes Arthritis in his sister; Bell's palsy in his mother; Diabetes in his mother; Heart attack in his father and mother; Stroke in his father and mother.  Wt Readings from Last 3 Encounters:  07/06/17 207 lb (93.9 kg)  05/01/17 213 lb 9.6 oz (96.9 kg)  03/21/17 209 lb 3.2 oz (94.9 kg)    PHYSICAL EXAM BP (!) 114/50   Pulse (!) 55   Ht 5' 8.5" (1.74 m)   Wt 207 lb (93.9 kg)   BMI 31.02 kg/m  Physical Exam  Constitutional: He is oriented to person, place, and time. He appears well-developed and well-nourished. No distress.  HENT:  Head: Normocephalic and atraumatic.  Eyes: EOM are normal.  Neck: Normal range of motion. Neck supple. No JVD present.  Cardiovascular: Normal rate, regular rhythm and intact distal pulses.  Exam reveals no gallop and no friction rub.   No murmur heard. Pulmonary/Chest: Effort normal and breath sounds normal. No respiratory distress. He has no wheezes. He has no rales.  Abdominal: Soft. Bowel sounds are normal. He exhibits no distension. There is no tenderness. There is no rebound and no guarding.  Musculoskeletal: Normal range of motion. He exhibits edema (trivial ankle-pedal).  Neurological: He is alert and oriented to person, place, and time.  Skin: Skin is warm and dry. No rash noted. No erythema.  bilateral ankles have spider veins and mild varicose veins. No real venous stasis changes  Psychiatric: He has a normal mood and affect. His behavior is normal. Judgment and thought  content normal.  Nursing note and vitals reviewed.    Adult ECG Report  Rate: 55 ;  Rhythm: sinus bradycardia and 1 AVB. Otherwise normal axis, intervals and durations;   Narrative Interpretation: stable EKG   Other studies Reviewed:  Additional studies/ records that were reviewed today include:  Recent Labs:  Followed by PCP  No results found for: CHOL, HDL, LDLCALC, LDLDIRECT, TRIG, CHOLHDL    ASSESSMENT / PLAN: Problem List Items Addressed This Visit    CAD S/P PCI to RCA - 2001; Cx 2003 - Primary (Chronic)    Overall doing very well from a symptom standpoint. No active angina or heart failure symptoms.  He remains on aspirin without Plavix. He is on carvedilol at overlap twice a day, lisinopril 40 mg daily and amlodipine 2.5 mg daily.This is in addition to simvastatin 40 mg daily.  With his ongoing fatigue complaint, I think we can probably back down on his carvedilol 12.5 mg twice a day. In doing so we may need to increase the amlodipine dose to 5 mg if blood pressure increases.      Relevant Medications   carvedilol (COREG) 12.5 MG tablet   Other Relevant Orders   EKG 12-Lead (Completed)   Essential hypertension (Chronic)    If anything, his BP today is on the Low end -- should be ok decreasing Carvedilol to 12.5 mg BID -- may need to increase CCB dose to 5 mg if BP increases.  Should be OK for PRN (not standing) NSAIDs for OA pains.      Relevant Medications   carvedilol (COREG) 12.5 MG tablet   Other Relevant Orders   EKG 12-Lead (Completed)   Hyperlipidemia with target LDL less than 70 (Chronic)    On Simvastatin, fenofibrate & Omega 3 FA -- labs followed by PCP, but not available.. Would like to change to atorvastatin or rosuvastatin in setting of amlodipine & also for more potent lipid lowering.      Relevant Medications   carvedilol (COREG) 12.5 MG tablet   Other Relevant Orders   EKG 12-Lead (Completed)   Obesity (BMI 30-39.9) (Chronic)    Difficult to  exercise - but trying. Needs to stay active with Chronic Back Pain.      Sinus bradycardia by electrocardiogram    ith his complaint of fatigue and resting bradycardia, I think we have room to back off on his beta blocker. He could be noticing signs of some mild chronotropic incompetence related to his beta blocker.   Plan: Reduce Carvedilol  To 12.5 mg twice a day      Relevant Orders   EKG 12-Lead (Completed)      Current medicines are reviewed at length with the patient today. (+/- concerns) Fatige The following changes have been made: -- reduce BB dose.  Patient Instructions  MEDICATION CHANGE  CARVEDILOL  12.5 MG ( 1/2 TABLET OF 25 MG ) TWICE A DAY  CALL OFFICE TO CALL NEW PRESCRIPTION INTO PHARMACY WHEN YOU NEED REFILL  (HAVE PRIMARY TO CHECK BLOOD PRESSURE  IN MONTH WHEN GO FOR ANNUAL VISIT)    Your physician wants you to follow-up in 12 MONTHS WITH DR HARDING.You will receive a reminder letter in the mail two months in advance. If you don't receive a letter, please call our office to schedule the follow-up appointment.    Studies Ordered:   Orders Placed This Encounter  Procedures  . EKG 12-Lead      Glenetta Hew, M.D., M.S. Interventional Cardiologist   Pager # [phone] Phone # [phone] [address] [place], [place] [address]

## 2017-07-06 NOTE — Patient Instructions (Addendum)
MEDICATION CHANGE  CARVEDILOL  12.5 MG ( 1/2 TABLET OF 25 MG ) TWICE A DAY  CALL OFFICE TO CALL NEW PRESCRIPTION INTO PHARMACY WHEN YOU NEED REFILL  (HAVE PRIMARY TO CHECK BLOOD PRESSURE  IN MONTH WHEN GO FOR ANNUAL VISIT)    Your physician wants you to follow-up in 12 MONTHS WITH DR HARDING.You will receive a reminder letter in the mail two months in advance. If you don't receive a letter, please call our office to schedule the follow-up appointment.

## 2017-07-08 ENCOUNTER — Encounter: Payer: Self-pay | Admitting: Cardiology

## 2017-07-08 NOTE — Assessment & Plan Note (Signed)
Difficult to exercise - but trying. Needs to stay active with Chronic Back Pain.

## 2017-07-08 NOTE — Assessment & Plan Note (Signed)
If anything, his BP today is on the Low end -- should be ok decreasing Carvedilol to 12.5 mg BID -- may need to increase CCB dose to 5 mg if BP increases.  Should be OK for PRN (not standing) NSAIDs for OA pains.

## 2017-07-08 NOTE — Assessment & Plan Note (Signed)
Overall doing very well from a symptom standpoint. No active angina or heart failure symptoms.  He remains on aspirin without Plavix. He is on carvedilol at overlap twice a day, lisinopril 40 mg daily and amlodipine 2.5 mg daily.This is in addition to simvastatin 40 mg daily.  With his ongoing fatigue complaint, I think we can probably back down on his carvedilol 12.5 mg twice a day. In doing so we may need to increase the amlodipine dose to 5 mg if blood pressure increases.

## 2017-07-08 NOTE — Assessment & Plan Note (Addendum)
On Simvastatin, fenofibrate & Omega 3 FA -- labs followed by PCP, but not available.. Would like to change to atorvastatin or rosuvastatin in setting of amlodipine & also for more potent lipid lowering.

## 2017-07-08 NOTE — Assessment & Plan Note (Signed)
ith his complaint of fatigue and resting bradycardia, I think we have room to back off on his beta blocker. He could be noticing signs of some mild chronotropic incompetence related to his beta blocker.   Plan: Reduce Carvedilol  To 12.5 mg twice a day

## 2017-07-27 DIAGNOSIS — L570 Actinic keratosis: Secondary | ICD-10-CM | POA: Diagnosis not present

## 2017-07-27 DIAGNOSIS — L821 Other seborrheic keratosis: Secondary | ICD-10-CM | POA: Diagnosis not present

## 2017-07-27 DIAGNOSIS — D229 Melanocytic nevi, unspecified: Secondary | ICD-10-CM | POA: Diagnosis not present

## 2017-08-29 DIAGNOSIS — R82998 Other abnormal findings in urine: Secondary | ICD-10-CM | POA: Diagnosis not present

## 2017-08-29 DIAGNOSIS — N183 Chronic kidney disease, stage 3 (moderate): Secondary | ICD-10-CM | POA: Diagnosis not present

## 2017-08-29 DIAGNOSIS — E1151 Type 2 diabetes mellitus with diabetic peripheral angiopathy without gangrene: Secondary | ICD-10-CM | POA: Diagnosis not present

## 2017-08-29 DIAGNOSIS — R5381 Other malaise: Secondary | ICD-10-CM | POA: Diagnosis not present

## 2017-08-29 DIAGNOSIS — Z79899 Other long term (current) drug therapy: Secondary | ICD-10-CM | POA: Diagnosis not present

## 2017-08-29 DIAGNOSIS — R5383 Other fatigue: Secondary | ICD-10-CM | POA: Diagnosis not present

## 2017-08-29 DIAGNOSIS — Z125 Encounter for screening for malignant neoplasm of prostate: Secondary | ICD-10-CM | POA: Diagnosis not present

## 2017-08-29 DIAGNOSIS — E7849 Other hyperlipidemia: Secondary | ICD-10-CM | POA: Diagnosis not present

## 2017-08-29 DIAGNOSIS — E038 Other specified hypothyroidism: Secondary | ICD-10-CM | POA: Diagnosis not present

## 2017-09-05 DIAGNOSIS — E1151 Type 2 diabetes mellitus with diabetic peripheral angiopathy without gangrene: Secondary | ICD-10-CM | POA: Diagnosis not present

## 2017-09-05 DIAGNOSIS — I7389 Other specified peripheral vascular diseases: Secondary | ICD-10-CM | POA: Diagnosis not present

## 2017-09-05 DIAGNOSIS — E7849 Other hyperlipidemia: Secondary | ICD-10-CM | POA: Diagnosis not present

## 2017-09-05 DIAGNOSIS — E038 Other specified hypothyroidism: Secondary | ICD-10-CM | POA: Diagnosis not present

## 2017-09-05 DIAGNOSIS — R5383 Other fatigue: Secondary | ICD-10-CM | POA: Diagnosis not present

## 2017-09-05 DIAGNOSIS — Z6831 Body mass index (BMI) 31.0-31.9, adult: Secondary | ICD-10-CM | POA: Diagnosis not present

## 2017-09-05 DIAGNOSIS — I2510 Atherosclerotic heart disease of native coronary artery without angina pectoris: Secondary | ICD-10-CM | POA: Diagnosis not present

## 2017-09-05 DIAGNOSIS — K219 Gastro-esophageal reflux disease without esophagitis: Secondary | ICD-10-CM | POA: Diagnosis not present

## 2017-09-05 DIAGNOSIS — Z0000 Encounter for general adult medical examination without abnormal findings: Secondary | ICD-10-CM | POA: Diagnosis not present

## 2017-09-05 DIAGNOSIS — Z1389 Encounter for screening for other disorder: Secondary | ICD-10-CM | POA: Diagnosis not present

## 2017-09-05 DIAGNOSIS — I10 Essential (primary) hypertension: Secondary | ICD-10-CM | POA: Diagnosis not present

## 2017-09-05 DIAGNOSIS — N183 Chronic kidney disease, stage 3 (moderate): Secondary | ICD-10-CM | POA: Diagnosis not present

## 2017-09-07 DIAGNOSIS — Z1212 Encounter for screening for malignant neoplasm of rectum: Secondary | ICD-10-CM | POA: Diagnosis not present

## 2017-09-11 ENCOUNTER — Telehealth: Payer: Self-pay | Admitting: Cardiology

## 2017-09-11 NOTE — Telephone Encounter (Signed)
New message   Patient spouse calling to request patient not take Lipitor as prescribed by Dr Sharlett Iles. Spouse states " Dr Ellyn Hack  advised  Dr Sharlett Iles patient should take Lipitor". Patient wife states Lipitor causes muscle pain.   Pt c/o medication issue:  1. Name of Medication: Lipitor  2. How are you currently taking this medication (dosage and times per day)? As prescribed  3. Are you having a reaction (difficulty breathing--STAT)? NO  4. What is your medication issue? Can not tolerate Liptior

## 2017-09-11 NOTE — Telephone Encounter (Signed)
Pt answered, stated wife on phone w someone else & will try to call back.

## 2017-09-11 NOTE — Telephone Encounter (Signed)
Spoke w wife of patient, she reports Dr. Philip Aspen wanted to change patient from Simvastatin to atorvastatin - looks like atorvastatin or rosuvastatin was recommended by Dr Ellyn Hack at last [place]  Per pt wife, pt has historical intolerance to atorvastatin - muscle aches - and does not wish to take. Wants recommendation for different med.   Aware we have not received copies of recent labwork done ~1.5 weeks ago at Dr. Shon Baton office - I called and left msg to request this be faxed to our clinical fax to mine or Dr. Allison Quarry attn.  Pt will remain on simvastatin until further instruction.

## 2017-09-12 MED ORDER — ROSUVASTATIN CALCIUM 20 MG PO TABS: 20.0000 mg | ORAL_TABLET | Freq: Every day | ORAL | 5 refills | Status: DC

## 2017-09-12 NOTE — Telephone Encounter (Signed)
Spoke w wife, aware of changes/recommendations, she verbalized understanding. Pt will start rosuvastatin, aware to discontinue simvastatin, no other changes to meds.   rx called to pt pharmacy. Aware to call if new concerns or side effects.

## 2017-09-12 NOTE — Telephone Encounter (Signed)
Dr. Sharlett Iles warned him to switch to rosuvastatin.  I would recommend rosuvastatin because symptoms.  Less start with 20 mg daily.  Glenetta Hew, MD

## 2017-10-01 IMAGING — DX DG LUMBAR SPINE 2-3V
3 series · 3 of 3 positions shown · non-contrast
Comparison: None in PACs

CLINICAL DATA: A. hip pain worsening over the past 3-4 years. No
recent trauma.

EXAM:
LUMBAR SPINE - 2-3 VIEW

[lumbar spine ap]
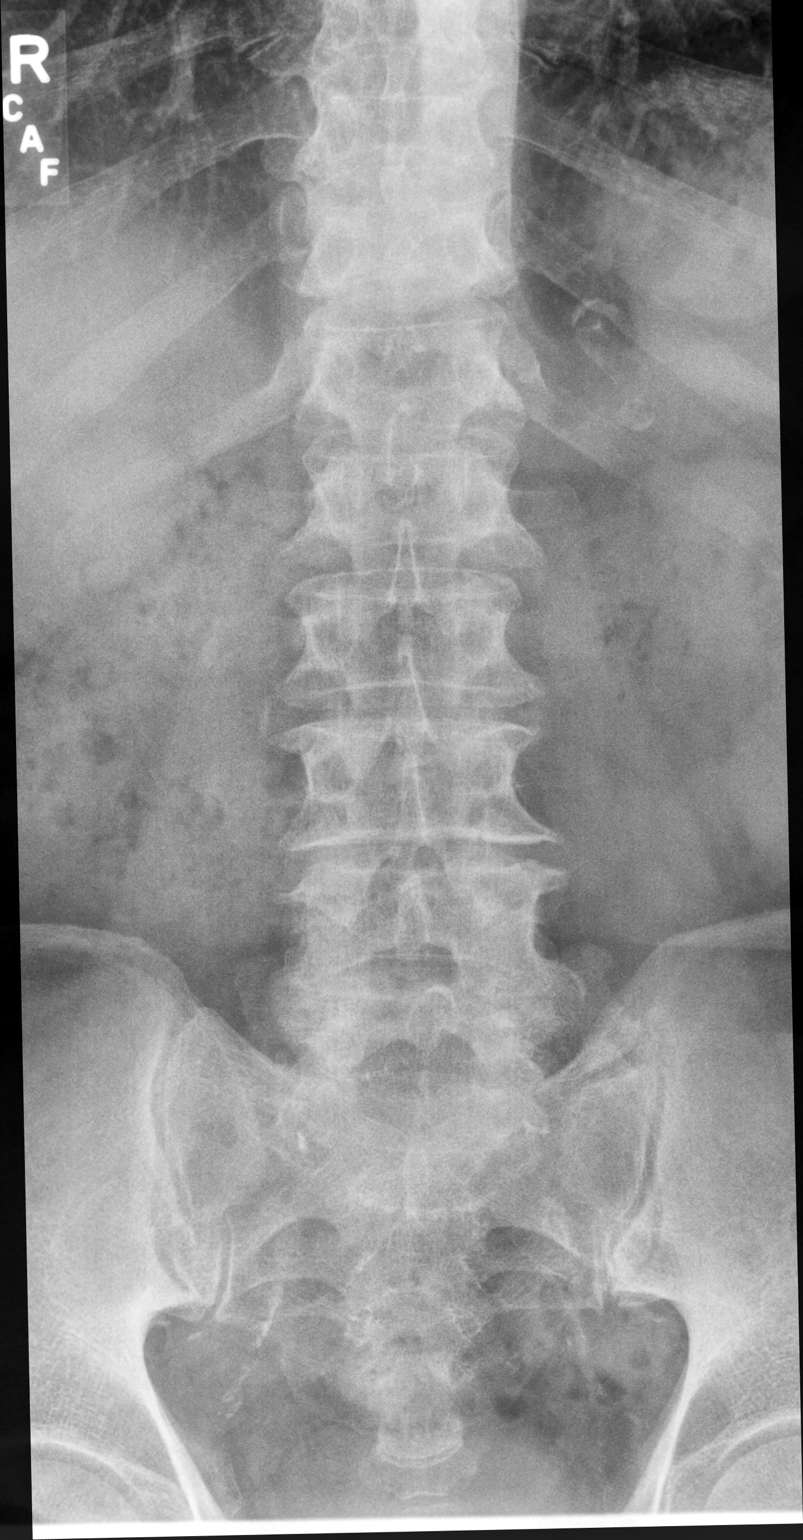

[lumbar spine lat (1 of 2)]
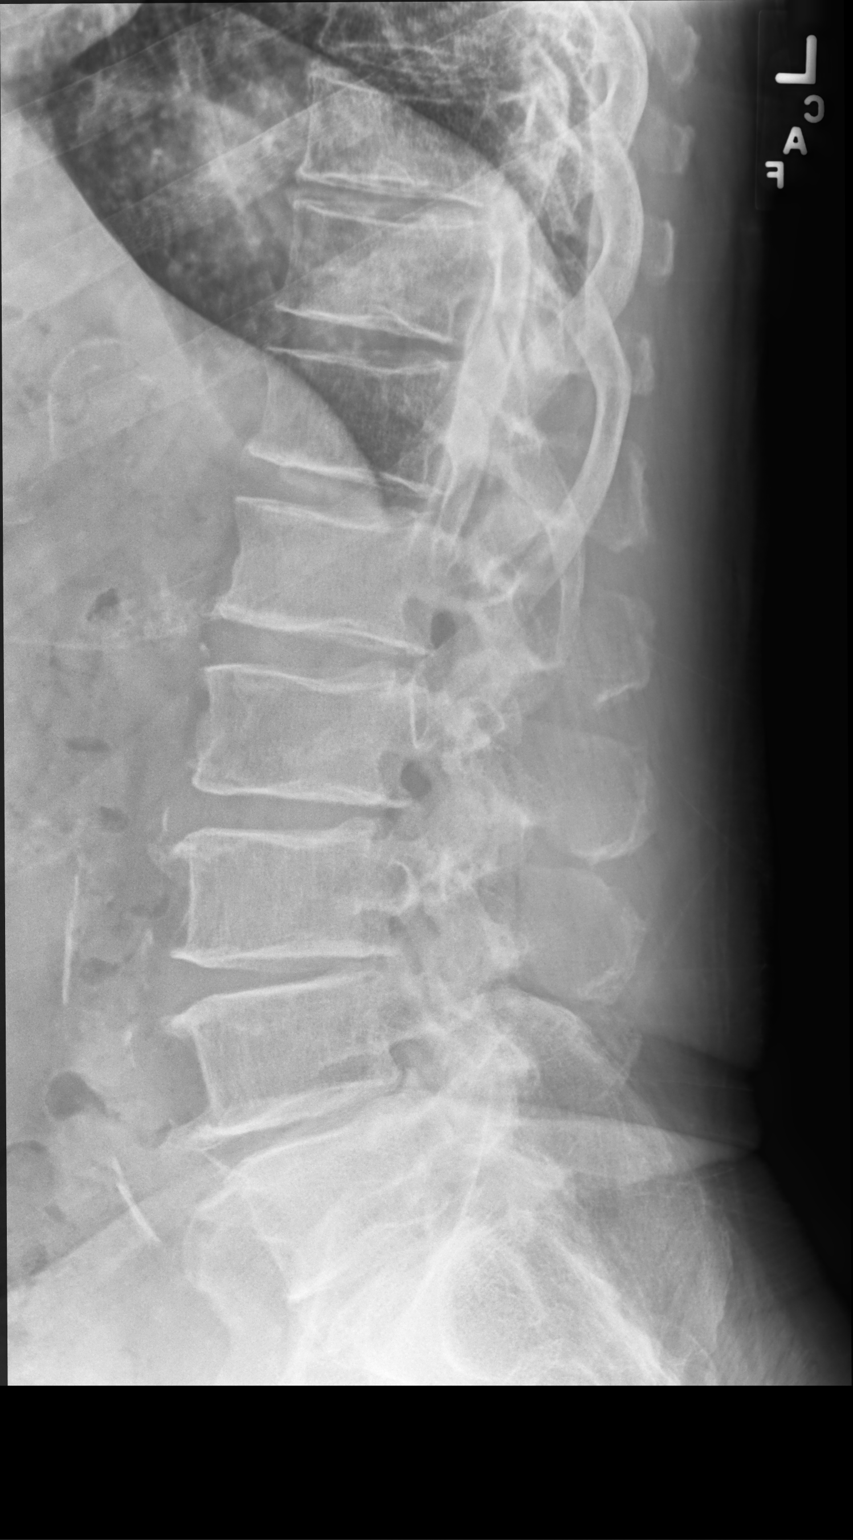

[lumbar spine lat (2 of 2)]
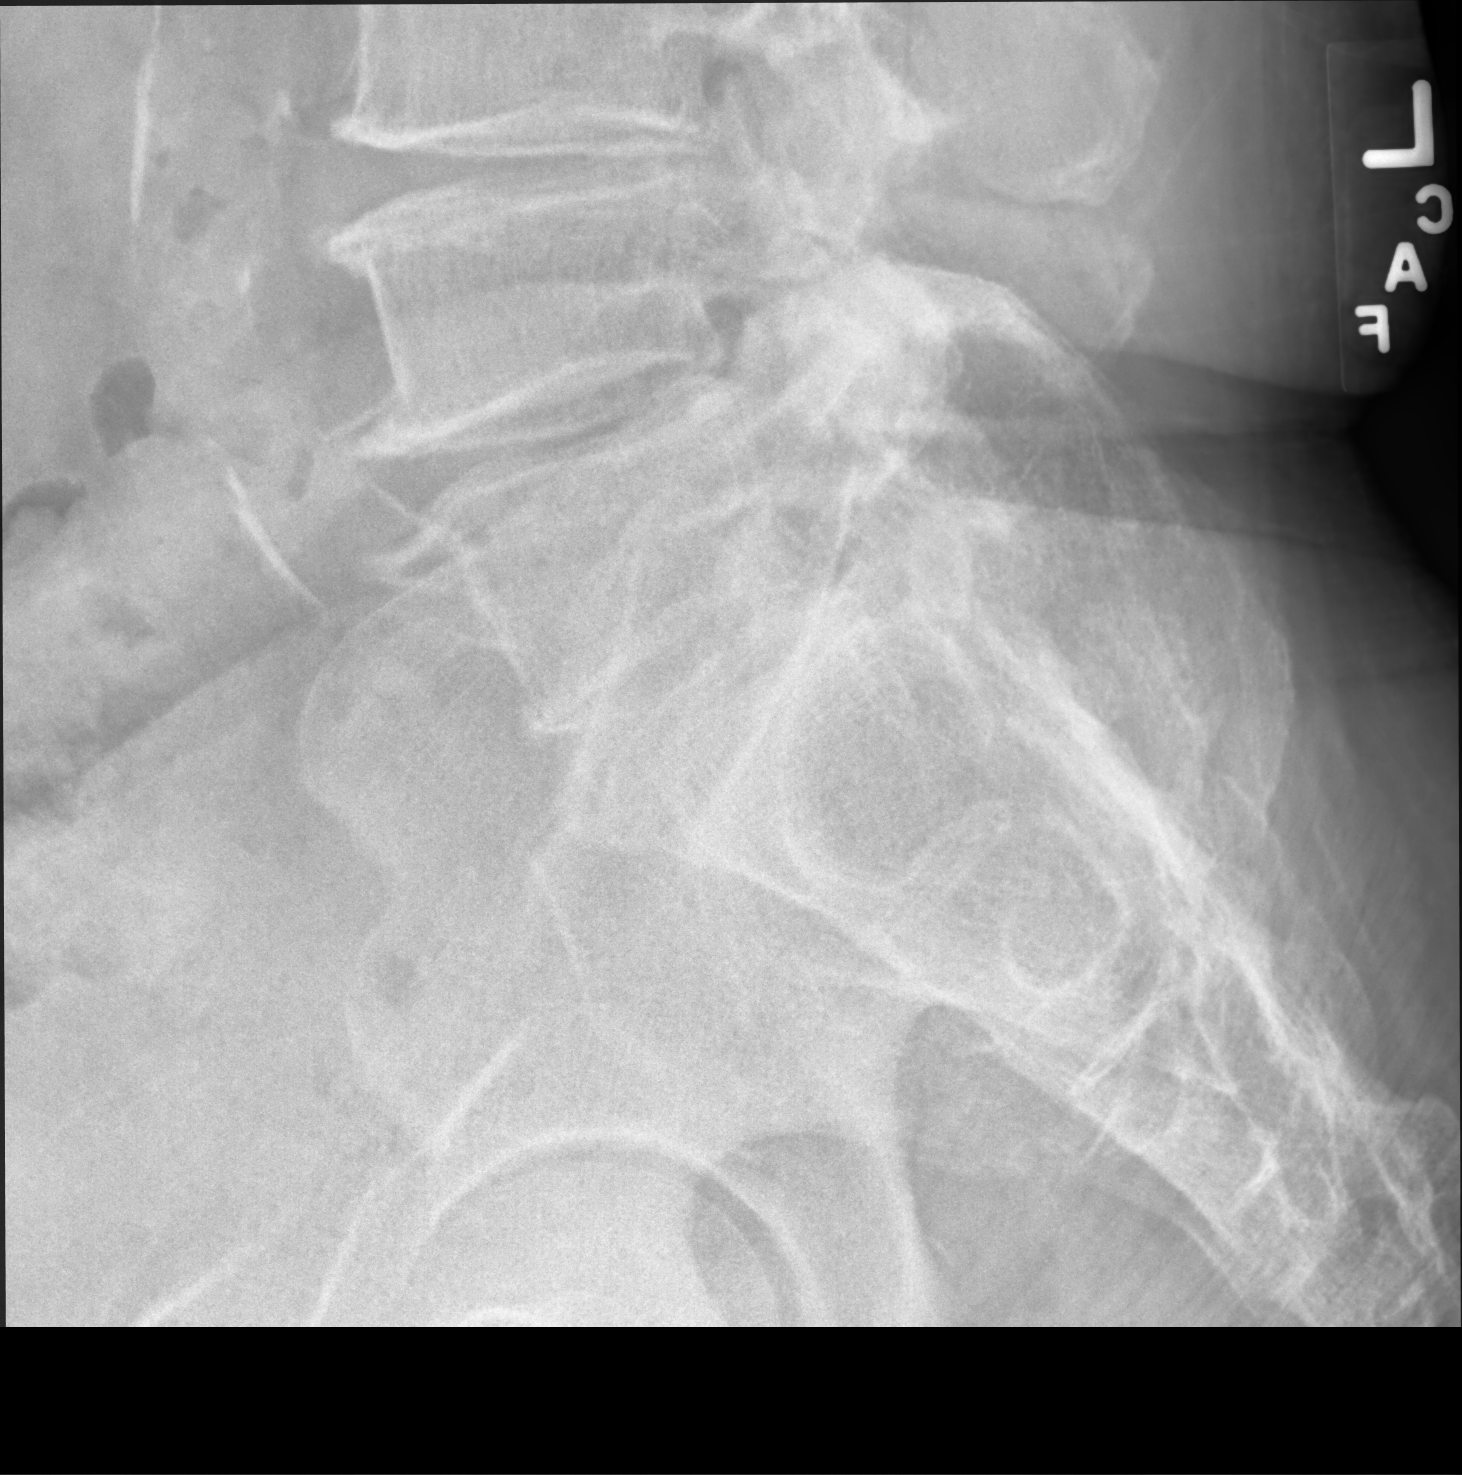

[3 of 3 positions shown; findings below may reference images not displayed]

FINDINGS: The lumbar vertebral bodies are preserved in height. There is mild
disc space narrowing at L2-3 with moderate narrowing at L3-4 and
L4-5. There is facet joint hypertrophy at L4-5 and L5-S1. There is
no spondylolisthesis. The pedicles and transverse processes are
intact. There is mural calcification in the abdominal aorta and
common iliac arteries.
IMPRESSION: Multilevel degenerative disc disease as described. No compression
fracture or spondylolisthesis.

## 2017-10-04 ENCOUNTER — Ambulatory Visit (INDEPENDENT_AMBULATORY_CARE_PROVIDER_SITE_OTHER): Payer: Medicare Other | Admitting: Sports Medicine

## 2017-10-04 ENCOUNTER — Encounter: Payer: Self-pay | Admitting: Sports Medicine

## 2017-10-04 VITALS — BP 122/66 | HR 67 | Ht 68.5 in | Wt 201.0 lb

## 2017-10-04 DIAGNOSIS — M25871 Other specified joint disorders, right ankle and foot: Secondary | ICD-10-CM | POA: Diagnosis not present

## 2017-10-04 DIAGNOSIS — Z9861 Coronary angioplasty status: Secondary | ICD-10-CM

## 2017-10-04 DIAGNOSIS — I2510 Atherosclerotic heart disease of native coronary artery without angina pectoris: Secondary | ICD-10-CM

## 2017-10-04 NOTE — Assessment & Plan Note (Signed)
Symptoms are consistent with acute ankle impingement likely exacerbated by shoveling snow over the recent weekend.  Symptoms were severe for 24 hours but then essentially completely abated.  He has a small amount of effusion today but this is minimal.  The lack of improvement can consider intra-articular injection for both diagnostic and therapeutic purposes but will defer any further management other than compression and cold water soaking as needed.

## 2017-10-04 NOTE — Progress Notes (Signed)
Nicholas Hamilton. Nicholas Hamilton, [place] at [facility] [phone]  NETANEL YANNUZZI - 71 y.o. male MRN [id number]  Date of birth: 12-03-1945   Scribe for today's visit: Wendy Poet, ATC    SUBJECTIVE:  Nicholas Hamilton is here for Initial Assessment (R ankle pain) .   His R ankle pain symptoms INITIALLY: Began on Monday, Dec. 10, 2018.  He recalls no specific MOI but notes that he slipped when shoveling snow on Monday.  He states that it was bothering him Monday night to the point that it prevented him from sleeping, particularly if his sheets or anything touched it. Described as severe, sharp pain radiating to the R lower leg Worsened with anything touching it and movement. Improved with nothing noted. Additional associated symptoms include: R lateral ankle swelling    At this time symptoms are improving compared to onset w/ decreased pain He has been using Tylenol and used a cane yesterday.   ROS Denies night time disturbances. Reports fevers, chills, or night sweats.  Noted chills on Monday night. Denies unexplained weight loss. Denies personal history of cancer. Reports changes in bowel or bladder habits.  Taking longer to urinate. Denies recent unreported falls. Denies new or worsening dyspnea or wheezing. Denies headaches or dizziness.  Reports numbness, tingling or weakness  In the extremities. Numbness noted in his B feet. Denies dizziness or presyncopal episodes Denies lower extremity edema      HISTORY & PERTINENT PRIOR DATA:  Prior History reviewed and updated per electronic medical record.  Significant history, findings, studies and interim changes include:  reports that he quit smoking about 16 years ago. His smoking use included cigarettes. He has a 40.00 pack-year smoking history. His smokeless tobacco use includes chew. No results for input(s): HGBA1C, LABURIC, CREATINE in the last 8760 hours. No specialty comments  available. Problem  Ankle Impingement Syndrome, Right     OBJECTIVE:  VS:  HT:5' 8.5" (174 cm)   WT:201 lb (91.2 kg)  BMI:30.11    BP:122/66  HR:67bpm  TEMP: ( )  RESP:96 %  PHYSICAL EXAM: Constitutional: WDWN, Non-toxic appearing. Psychiatric: Alert & appropriately interactive. Not depressed or anxious appearing. Respiratory: No increased work of breathing. Trachea Midline Eyes: Pupils are equal. EOM intact without nystagmus. No scleral icterus Cardiovascular:  Peripheral Pulses: peripheral pulses symmetrical No clubbing or cyanosis appreciated Capillary Refill is normal, less than 2 seconds No signficant generalized edema/anasarca Sensory Exam: intact to light touch although he has a generalized dysesthesia in bilateral lower extremities in a stocking distribution.  Right ankle: Small amount of edema within the ankle joint diffusely.  Focal anterior and lateral joint line pain but this is mild.  Ankle drawer testing is stable but slightly painful.  Mild pain with talar tilt.  Stable cotton testing.  Dorsiflexion, plantarflexion, inversion and eversion strength is intact and range of motion is normal.  No additional findings.   ASSESSMENT & PLAN:   1. Ankle impingement syndrome, right    PLAN: No history of gout although acute presentation does seem somewhat gout like but given the 24-hour resolution low likelihood but could consider uric acid level testing if any recurrence.  Ankle impingement syndrome, right Symptoms are consistent with acute ankle impingement likely exacerbated by shoveling snow over the recent weekend.  Symptoms were severe for 24 hours but then essentially completely abated.  He has a small amount of effusion today but this is minimal.  The lack of improvement  can consider intra-articular injection for both diagnostic and therapeutic purposes but will defer any further management other than compression and cold water soaking as needed.     ++++++++++++++++++++++++++++++++++++++++++++ Follow-up: Return if symptoms worsen or fail to improve.   Pertinent documentation may be included in additional procedure notes, imaging studies, problem based documentation and patient instructions. Please see these sections of the encounter for additional information regarding this visit. CMA/ATC served as Education administrator during this visit. History, Physical, and Plan performed by medical provider. Documentation and orders reviewed and attested to.      Gerda Diss, [place] [facility] Physician

## 2017-10-11 DIAGNOSIS — H2512 Age-related nuclear cataract, left eye: Secondary | ICD-10-CM | POA: Diagnosis not present

## 2017-10-11 DIAGNOSIS — E119 Type 2 diabetes mellitus without complications: Secondary | ICD-10-CM | POA: Diagnosis not present

## 2017-10-11 DIAGNOSIS — H52203 Unspecified astigmatism, bilateral: Secondary | ICD-10-CM | POA: Diagnosis not present

## 2017-10-11 DIAGNOSIS — H5010 Unspecified exotropia: Secondary | ICD-10-CM | POA: Diagnosis not present

## 2017-10-13 DIAGNOSIS — Z23 Encounter for immunization: Secondary | ICD-10-CM | POA: Diagnosis not present

## 2017-12-12 DIAGNOSIS — I7389 Other specified peripheral vascular diseases: Secondary | ICD-10-CM | POA: Diagnosis not present

## 2017-12-12 DIAGNOSIS — I2510 Atherosclerotic heart disease of native coronary artery without angina pectoris: Secondary | ICD-10-CM | POA: Diagnosis not present

## 2017-12-12 DIAGNOSIS — I10 Essential (primary) hypertension: Secondary | ICD-10-CM | POA: Diagnosis not present

## 2017-12-12 DIAGNOSIS — Z1389 Encounter for screening for other disorder: Secondary | ICD-10-CM | POA: Diagnosis not present

## 2017-12-12 DIAGNOSIS — E1151 Type 2 diabetes mellitus with diabetic peripheral angiopathy without gangrene: Secondary | ICD-10-CM | POA: Diagnosis not present

## 2017-12-12 DIAGNOSIS — Z6831 Body mass index (BMI) 31.0-31.9, adult: Secondary | ICD-10-CM | POA: Diagnosis not present

## 2018-02-02 ENCOUNTER — Telehealth: Payer: Self-pay | Admitting: Cardiology

## 2018-02-02 NOTE — Telephone Encounter (Signed)
New message  Patient requesting new script with option to fill as brandname or generic Patient plans to use copay card  *STAT* If patient is at the pharmacy, call can be transferred to refill team.   1. Which medications need to be refilled? (please list name of each medication and dose if known)rosuvastatin (CRESTOR) 20 MG tablet  2. Which pharmacy/location (including street and city if local pharmacy) is medication to be sent to?[facility] [address] [place]  3. Do they need a 30 day or 90 day supply? [place]

## 2018-02-05 NOTE — Telephone Encounter (Signed)
New message  Patient requesting new script with option to fill as brandname or generic Patient plans to use copay card  *STAT* If patient is at the pharmacy, call can be transferred to refill team.   1. Which medications need to be refilled? (please list name of each medication and dose if known)rosuvastatin (CRESTOR) 20 MG tablet  2. Which pharmacy/location (including street and city if local pharmacy) is medication to be sent to?[facility] [address] [place]  3. Do they need a 30 day or 90 day supply? [place]

## 2018-02-06 MED ORDER — ROSUVASTATIN CALCIUM 20 MG PO TABS: 20.0000 mg | ORAL_TABLET | Freq: Every day | ORAL | 8 refills | Status: DC

## 2018-04-16 DIAGNOSIS — K219 Gastro-esophageal reflux disease without esophagitis: Secondary | ICD-10-CM | POA: Diagnosis not present

## 2018-04-16 DIAGNOSIS — J329 Chronic sinusitis, unspecified: Secondary | ICD-10-CM | POA: Diagnosis not present

## 2018-04-16 DIAGNOSIS — R05 Cough: Secondary | ICD-10-CM | POA: Diagnosis not present

## 2018-04-16 DIAGNOSIS — Z6831 Body mass index (BMI) 31.0-31.9, adult: Secondary | ICD-10-CM | POA: Diagnosis not present

## 2018-04-16 DIAGNOSIS — R5383 Other fatigue: Secondary | ICD-10-CM | POA: Diagnosis not present

## 2018-04-23 DIAGNOSIS — E7849 Other hyperlipidemia: Secondary | ICD-10-CM | POA: Diagnosis not present

## 2018-04-23 DIAGNOSIS — E038 Other specified hypothyroidism: Secondary | ICD-10-CM | POA: Diagnosis not present

## 2018-04-23 DIAGNOSIS — E1151 Type 2 diabetes mellitus with diabetic peripheral angiopathy without gangrene: Secondary | ICD-10-CM | POA: Diagnosis not present

## 2018-04-23 DIAGNOSIS — I7389 Other specified peripheral vascular diseases: Secondary | ICD-10-CM | POA: Diagnosis not present

## 2018-04-23 DIAGNOSIS — E1140 Type 2 diabetes mellitus with diabetic neuropathy, unspecified: Secondary | ICD-10-CM | POA: Diagnosis not present

## 2018-04-23 DIAGNOSIS — Z6831 Body mass index (BMI) 31.0-31.9, adult: Secondary | ICD-10-CM | POA: Diagnosis not present

## 2018-04-23 DIAGNOSIS — E668 Other obesity: Secondary | ICD-10-CM | POA: Diagnosis not present

## 2018-04-23 DIAGNOSIS — R5383 Other fatigue: Secondary | ICD-10-CM | POA: Diagnosis not present

## 2018-04-23 DIAGNOSIS — I2510 Atherosclerotic heart disease of native coronary artery without angina pectoris: Secondary | ICD-10-CM | POA: Diagnosis not present

## 2018-04-23 DIAGNOSIS — I10 Essential (primary) hypertension: Secondary | ICD-10-CM | POA: Diagnosis not present

## 2018-04-23 DIAGNOSIS — R0683 Snoring: Secondary | ICD-10-CM | POA: Diagnosis not present

## 2018-09-03 ENCOUNTER — Ambulatory Visit (INDEPENDENT_AMBULATORY_CARE_PROVIDER_SITE_OTHER): Payer: Medicare Other | Admitting: Cardiology

## 2018-09-03 ENCOUNTER — Encounter: Payer: Self-pay | Admitting: Cardiology

## 2018-09-03 VITALS — BP 126/64 | HR 58 | Ht 69.0 in | Wt 201.2 lb

## 2018-09-03 DIAGNOSIS — R001 Bradycardia, unspecified: Secondary | ICD-10-CM | POA: Diagnosis not present

## 2018-09-03 DIAGNOSIS — E785 Hyperlipidemia, unspecified: Secondary | ICD-10-CM | POA: Diagnosis not present

## 2018-09-03 DIAGNOSIS — Z9861 Coronary angioplasty status: Secondary | ICD-10-CM

## 2018-09-03 DIAGNOSIS — I2510 Atherosclerotic heart disease of native coronary artery without angina pectoris: Secondary | ICD-10-CM

## 2018-09-03 DIAGNOSIS — I10 Essential (primary) hypertension: Secondary | ICD-10-CM | POA: Diagnosis not present

## 2018-09-03 NOTE — Patient Instructions (Signed)
Medication Instructions:  Not needed If you need a refill on your cardiac medications before your next appointment, please call your pharmacy.   Lab work: Not needed If you have labs (blood work) drawn today and your tests are completely normal, you will receive your results only by: . MyChart Message (if you have MyChart) OR . A paper copy in the mail If you have any lab test that is abnormal or we need to change your treatment, we will call you to review the results.  Testing/Procedures: Not needed  Follow-Up: At [facility], you and your health needs are our priority.  As part of our continuing mission to provide you with exceptional heart care, we have created designated Provider Care Teams.  These Care Teams include your primary Cardiologist (physician) and Advanced Practice Providers (APPs -  Physician Assistants and Nurse Practitioners) who all work together to provide you with the care you need, when you need it. You will need a follow up appointment in 12 months.  Please call our office 2 months in advance to schedule this appointment.  You may see David Harding, MD or one of the following Advanced Practice Providers on your designated Care Team:   Rhonda Barrett, PA-C . Kathryn Lawrence, DNP, ANP  Any Other Special Instructions Will Be Listed Below (If Applicable).   

## 2018-09-03 NOTE — Progress Notes (Signed)
PCP: Leanna Battles, MD  Clinic Note: Chief Complaint  Patient presents with  . Follow-up    Doing well  . Coronary Artery Disease    HPI: Nicholas Hamilton is a 72 y.o. male with a PMH of CAD-PCI RCA & Cx who presents today for Annual follow-up.  He is a former patient of a little, with an anginal equivalent of exertional dyspnea with hot and cold intolerance.  He usually exercises at the [facility] 3 days a week.  VENTURA HOLLENBECK was last seen on Sept 2018 -- he was doing well without any major complaints. Had a hard time getting into doing exercise because of other time constraints.  We reduce his beta-blocker in order to give him some more energy level..  Recent Hospitalizations: none  Studies Personally Reviewed - (if available, images/films reviewed: From Epic Chart or Care Everywhere)  none  Interval History: Maeson presents today still noted that he is doing very well.  He still enjoys going to the [facility] for his exercise.  He does acknowledges that his energy level is not quite what it used to be, but is better than it was last year.  He is now doing his stationary bicycle about 2 to 3 miles at a time.  He is bothered more by his osteoarthritis and neuropathy symptoms if he tries to walk.  He is starting to mix in more exercises with his routine including curls pull-ups as well as push-ups and sit ups.  With doing this he has lost about 6 pounds since I last saw him. He also notes that his energy level did pick up some we reduce his beta-blocker dose last visit.  With the amount of exercise he is doing he denies any chest tightness or pressure with rest or exertion.  No dyspnea.  The only time he notes short of breath as if he is rushing up a flight of steps or carrying something up steps.  He still takes a little bit to "get up and go "but once he is going he does fine.  When he notices that his heart rate will get pretty fast with strenuous activity, but mostly notes these  changes when it is either too hot or too cold. He denies any PND, orthopnea or edema.  No rapid irregular heartbeats palpitations.  No syncope/near syncope or TIA/amorous fugax. He denies any dyspnea or chest discomfort associated with these episodes. He just notes that it's hard to get going. Once he gets going he feels okay. No heart failure symptoms of PND or orthopnea, but does have some mild pedal edema at the end of the day. He may have a few skipped beats here and there, but denies any rapid irregular heartbeats or palpitations.  No syncope/near syncope or TIA/amaurosis fugax.  No claudication. He has bilateral pedal neuropathy.  ROS: A comprehensive was performed. Review of Systems  Constitutional: Positive for malaise/fatigue (less E than in the past, but better than last year). Negative for diaphoresis and fever.  HENT: Negative for nosebleeds.   Respiratory: Negative for cough, shortness of breath and wheezing.   Cardiovascular: Negative.   Gastrointestinal: Negative for abdominal pain, blood in stool and melena.  Genitourinary: Negative for hematuria.  Musculoskeletal: Negative for falls, joint pain (has meniscal tear) and neck pain.       L leg hurts & may feel weak - 2/2 knee pain  Neurological: Positive for tingling (Bilateral pedal neuropathy on the bottoms of his feet) and headaches (not  many & not close together). Negative for loss of consciousness and weakness.       Both feet feel somewhat numb with rare "shock" like Sx. In feet.  End of day - they ache.  Endo/Heme/Allergies: Does not bruise/bleed easily (if he hits something, does bleed easily ).  Psychiatric/Behavioral: Negative for depression and memory loss. The patient is not nervous/anxious.   All other systems reviewed and are negative.  Had Flu several months ago -- took ~2 + weeks to got better & longer to recover  I have reviewed and (if needed) personally updated the patient's problem list, medications,  allergies, past medical and surgical history, social and family history.   Past Medical History:  Diagnosis Date  . CAD S/P percutaneous coronary angioplasty 2001   PCI to RCA - 2001 (Tetra BMS 3.0 mm x 23 mm)  - BMS; Cx 2003 - Cypher DES 2.5 mm x 28 mm; CAATH 2006 - 80% stenosis in prox RPL with L-R collaterals, too small for PCI - Med Rx  . Diabetes mellitus type 2 with complications (HCC)    CAD  . GERD (gastroesophageal reflux disease)   . History of stress test - Exercise Cardiolite 01/2005   mildly abnormal; 7-METS, peak HR 136 = 84% of max predicted; symptoms:significant dyspnea with exercise induced PVCs-ventricular bigeminy. nondiagnostic inferolateral ST-T changes.EF 47% = Non-obstructive CAD with the exception of 80% RPL stenosis on cath  . Hyperlipidemia LDL goal <70   . Hypertension, essential   . Hyperthyroidism   . Stroke [facility])     Past Surgical History:  Procedure Laterality Date  . CARDIAC CATHETERIZATION  02/10/2005   EF 50% Mild left ventricular dysfuntion with a stent ejection fraction in posterior basilar segment hypokinesis, widely patent stents in the circumflex, High-grade stenosis is a very small posterior lateral branch with a large left to right collateral bed in the same area.  . CORONARY ANGIOPLASTY WITH STENT PLACEMENT  04/29/2002   STENTS: A 2.5 x 28 cypher stents was placed in such a manner that both the proximal and distal portion of the obstruction in the circumflex vessel were well covered.The initial inflation was13 atmospheres for 60 seconds with final inflation being 15 atmospheres for 60 second. each inflation, the patient had burning in the back of his throat and chest pressure; it resolved with deflating the ballon  . CORONARY ANGIOPLASTY WITH STENT PLACEMENT  06/30/2000   STENTS: A tetra stent 3.0 x 23, the stent was placed in such a manner that the dissection was well covered and deployed at 9 atmospheres for 59 seconds with the second inflation being  10 atmospheres for 57 seconds. the stent appeared to be slightly hyperexpanded ompared to the remainder of the vessel and there was a mild stent down at the distal portion of the stent.  [name] KNEE SURGERY Left 1983    Current Meds  Medication Sig  . acetaminophen (TYLENOL) 500 MG tablet Take 500 mg by mouth 2 (two) times daily.  [name] amLODipine (NORVASC) 2.5 MG tablet Take 2.5 mg by mouth at bedtime.   [name] aspirin 325 MG tablet Take 650 mg by mouth daily as needed for moderate pain.  [name] aspirin EC 81 MG tablet Take 81 mg by mouth at bedtime.  . carvedilol (COREG) 12.5 MG tablet Take 1 tablet (12.5 mg total) by mouth 2 (two) times daily with a meal.  . Cyanocobalamin (VITAMIN B12 PO) Take by mouth daily.  . fenofibrate micronized (LOFIBRA) 200 MG capsule Take  200 mg by mouth daily after breakfast.   . fish oil-omega-3 fatty acids 1000 MG capsule Take 1 g by mouth 2 (two) times daily.   . fluticasone (FLONASE) 50 MCG/ACT nasal spray Place 1 spray into both nostrils daily as needed (seasonal allergies).   [name] glimepiride (AMARYL) 2 MG tablet Take 2 mg by mouth daily before breakfast.  . hydrochlorothiazide (HYDRODIURIL) 25 MG tablet Take 12.5 mg by mouth daily.  [name] levothyroxine (SYNTHROID, LEVOTHROID) 175 MCG tablet Take 175 mcg by mouth See admin instructions. Take 1 tablet (175 mcg) by mouth Monday thru Saturday (skip Sundays)  . Liniments (SALONPAS) PADS Apply 1 each topically daily as needed (pain).  [name] lisinopril (PRINIVIL,ZESTRIL) 40 MG tablet Take 40 mg by mouth daily.  . metFORMIN (GLUCOPHAGE) 1000 MG tablet Take 500-1,000 mg by mouth 2 (two) times daily with a meal. Take 1/2 tablet (500 mg) by mouth with breakfast and 1 tablet (1000 mg) with supper  . nitroGLYCERIN (NITROSTAT) 0.4 MG SL tablet Place 1 tablet (0.4 mg total) under the tongue every 5 (five) minutes as needed for chest pain.  [name] omeprazole (PRILOSEC) 20 MG capsule Take 20 mg by mouth daily.  . simvastatin (ZOCOR) 40 MG tablet Take 40 mg  by mouth at bedtime.   . [DISCONTINUED] aspirin EC 325 MG tablet Take 650 mg by mouth daily as needed (pain).  . [DISCONTINUED] dapagliflozin propanediol (FARXIGA) 10 MG TABS tablet Take 10 mg by mouth daily after breakfast.  --The high-dose aspirin is only taken as needed for headache.  Allergies  Allergen Reactions  . Penicillins Other (See Comments)    Excessive sweating Has patient had a PCN reaction causing immediate rash, facial/tongue/throat swelling, SOB or lightheadedness with hypotension: No Has patient had a PCN reaction causing severe rash involving mucus membranes or skin necrosis: No Has patient had a PCN reaction that required hospitalization No Has patient had a PCN reaction occurring within the last 10 years: No If all of the above answers are "NO", then may proceed with Cephalosporin use.   Social History   Social History Narrative   : Married, 1 child. Chews tobacco, rare alcohol[facility] Talks about   wanting to lose weight.  Per Dr. Rex Kras, he needs to continue his exercise and diet and stop snacking between meals.    Social History   Social History Narrative   : Married, 1 child. Chews tobacco, rare alcohol. [facility] Talks about   wanting to lose weight.  Per Dr. Rex Kras, he needs to continue his exercise and diet and stop snacking between meals.     Family History family history includes Arthritis in his sister; Bell's palsy in his mother; Diabetes in his mother; Heart attack in his father and mother; Stroke in his father and mother.  Wt Readings from Last 3 Encounters:  09/03/18 201 lb 3.2 oz (91.3 kg)  10/04/17 201 lb (91.2 kg)  07/06/17 207 lb (93.9 kg)    PHYSICAL EXAM BP 126/64   Pulse (!) 58   Ht 5\' 9"  (1.753 m)   Wt 201 lb 3.2 oz (91.3 kg)   BMI 29.71 kg/m  Physical Exam  Constitutional: He is oriented to person, place, and time. He appears well-developed and well-nourished. No distress.  HENT:  Head:  Normocephalic and atraumatic.  Neck: Normal range of motion. Neck supple. No hepatojugular reflux and no JVD present. Carotid bruit is not present.  Cardiovascular: Normal rate, regular rhythm, S1  normal, normal heart sounds and intact distal pulses.  No extrasystoles are present. PMI is not displaced. Exam reveals no gallop and no friction rub.  No murmur heard. Pulmonary/Chest: Effort normal and breath sounds normal. No respiratory distress. He has no wheezes. He has no rales.  Abdominal: Soft. Bowel sounds are normal. He exhibits no distension. There is no tenderness. There is no rebound and no guarding.  No HSM  Musculoskeletal: Normal range of motion. He exhibits edema (Trivial ankle/pedal edema.  Really only notes that because his socks are tight).  Neurological: He is alert and oriented to person, place, and time.  Skin:  bilateral ankles have spider veins and mild varicose veins. No real venous stasis changes  Psychiatric: He has a normal mood and affect. His behavior is normal. Judgment and thought content normal.  Nursing note and vitals reviewed.    Adult ECG Report  Rate: 58 muscles in the car 1;  Rhythm: sinus bradycardia and 1 AVB. Otherwise normal axis, intervals and durations;   Narrative Interpretation: stable EKG   Other studies Reviewed: Additional studies/ records that were reviewed today include:  Recent Labs:  Followed by PCP--none since last year.  August 29, 2017: TC 159, TG 179, HDL 39, LDL 84.  A1c 5.9.  Creatinine 1.4. No results found for: CHOL, HDL, LDLCALC, LDLDIRECT, TRIG, CHOLHDL    ASSESSMENT / PLAN: Problem List Items Addressed This Visit    CAD S/P PCI to RCA - 2001; Cx 2003 - Primary (Chronic)    Continues to do well with no cardiac complaints.  No angina or heart failure.  Plan: Continue low-dose aspirin and statin.  Doing better with lower dose carvedilol with the addition of amlodipine.  No angina.  Also on ACE inhibitor as well as  Jardiance.      Relevant Medications   aspirin 325 MG tablet   Other Relevant Orders   EKG 12-Lead (Completed)   Essential hypertension (Chronic)    Well-controlled today on current dose carvedilol, amlodipine and lisinopril plus HCTZ.      Relevant Medications   aspirin 325 MG tablet   Other Relevant Orders   EKG 12-Lead (Completed)   Hyperlipidemia with target LDL less than 70 (Chronic)    On a combination of simvastatin, fenofibrate and omega-3 fatty acids.  Unfortunately, I do not have any recent labs on him since last year.  Would imagine his PCP is following his lipids.  Continue to titrate for LDL 70 or below it closer to 50 would be best.      Relevant Medications   aspirin 325 MG tablet   Sinus bradycardia by electrocardiogram    More prominent bradycardia last year.  We reduce his beta-blocker dose and now his heart rate seems stable.  No exercise intolerance now.      Relevant Orders   EKG 12-Lead (Completed)      Current medicines are reviewed at length with the patient today. (+/- concerns) Fatige The following changes have been made: -  Patient Instructions  Medication Instructions:  Not needed If you need a refill on your cardiac medications before your next appointment, please call your pharmacy.   Lab work: Not needed If you have labs (blood work) drawn today and your tests are completely normal, you will receive your results only by: [name] MyChart Message (if you have MyChart) OR . A paper copy in the mail If you have any lab test that is abnormal or we need to change your treatment,  we will call you to review the results.  Testing/Procedures: Not needed  Follow-Up: At [facility], you and your health needs are our priority.  As part of our continuing mission to provide you with exceptional heart care, we have created designated Provider Care Teams.  These Care Teams include your primary Cardiologist (physician) and Advanced Practice Providers  (APPs -  Physician Assistants and Nurse Practitioners) who all work together to provide you with the care you need, when you need it. You will need a follow up appointment in 12 months.  Please call our office 2 months in advance to schedule this appointment.  You may see Glenetta Hew, MD or one of the following Advanced Practice Providers on your designated Care Team:   Rosaria Ferries, PA-C . Jory Sims, DNP, ANP  Any Other Special Instructions Will Be Listed Below (If Applicable).     Studies Ordered:   Orders Placed This Encounter  Procedures  . EKG 12-Lead      Glenetta Hew, M.D., M.S. Interventional Cardiologist   Pager # [phone] Phone # [phone] [address] [place], [place] [address]

## 2018-09-05 ENCOUNTER — Encounter: Payer: Self-pay | Admitting: Cardiology

## 2018-09-05 ENCOUNTER — Other Ambulatory Visit: Payer: Self-pay | Admitting: Physician Assistant

## 2018-09-05 DIAGNOSIS — L814 Other melanin hyperpigmentation: Secondary | ICD-10-CM | POA: Diagnosis not present

## 2018-09-05 DIAGNOSIS — D229 Melanocytic nevi, unspecified: Secondary | ICD-10-CM | POA: Diagnosis not present

## 2018-09-05 DIAGNOSIS — D485 Neoplasm of uncertain behavior of skin: Secondary | ICD-10-CM | POA: Diagnosis not present

## 2018-09-05 DIAGNOSIS — L570 Actinic keratosis: Secondary | ICD-10-CM | POA: Diagnosis not present

## 2018-09-05 NOTE — Assessment & Plan Note (Signed)
Well-controlled today on current dose carvedilol, amlodipine and lisinopril plus HCTZ.

## 2018-09-05 NOTE — Assessment & Plan Note (Signed)
Continues to do well with no cardiac complaints.  No angina or heart failure.  Plan: Continue low-dose aspirin and statin.  Doing better with lower dose carvedilol with the addition of amlodipine.  No angina.  Also on ACE inhibitor as well as Jardiance.

## 2018-09-05 NOTE — Assessment & Plan Note (Signed)
On a combination of simvastatin, fenofibrate and omega-3 fatty acids.  Unfortunately, I do not have any recent labs on him since last year.  Would imagine his PCP is following his lipids.  Continue to titrate for LDL 70 or below it closer to 50 would be best.

## 2018-09-05 NOTE — Assessment & Plan Note (Signed)
More prominent bradycardia last year.  We reduce his beta-blocker dose and now his heart rate seems stable.  No exercise intolerance now.

## 2018-09-21 ENCOUNTER — Encounter: Payer: Self-pay | Admitting: Cardiology

## 2018-09-21 DIAGNOSIS — Z125 Encounter for screening for malignant neoplasm of prostate: Secondary | ICD-10-CM | POA: Diagnosis not present

## 2018-09-21 DIAGNOSIS — E1129 Type 2 diabetes mellitus with other diabetic kidney complication: Secondary | ICD-10-CM | POA: Diagnosis not present

## 2018-09-21 DIAGNOSIS — R82998 Other abnormal findings in urine: Secondary | ICD-10-CM | POA: Diagnosis not present

## 2018-09-21 DIAGNOSIS — E7849 Other hyperlipidemia: Secondary | ICD-10-CM | POA: Diagnosis not present

## 2018-09-21 DIAGNOSIS — N183 Chronic kidney disease, stage 3 (moderate): Secondary | ICD-10-CM | POA: Diagnosis not present

## 2018-09-21 DIAGNOSIS — E038 Other specified hypothyroidism: Secondary | ICD-10-CM | POA: Diagnosis not present

## 2018-09-28 DIAGNOSIS — E1129 Type 2 diabetes mellitus with other diabetic kidney complication: Secondary | ICD-10-CM | POA: Diagnosis not present

## 2018-09-28 DIAGNOSIS — Z6830 Body mass index (BMI) 30.0-30.9, adult: Secondary | ICD-10-CM | POA: Diagnosis not present

## 2018-09-28 DIAGNOSIS — E1140 Type 2 diabetes mellitus with diabetic neuropathy, unspecified: Secondary | ICD-10-CM | POA: Diagnosis not present

## 2018-09-28 DIAGNOSIS — N183 Chronic kidney disease, stage 3 (moderate): Secondary | ICD-10-CM | POA: Diagnosis not present

## 2018-09-28 DIAGNOSIS — Z0000 Encounter for general adult medical examination without abnormal findings: Secondary | ICD-10-CM | POA: Diagnosis not present

## 2018-09-28 DIAGNOSIS — I7389 Other specified peripheral vascular diseases: Secondary | ICD-10-CM | POA: Diagnosis not present

## 2018-09-28 DIAGNOSIS — I2510 Atherosclerotic heart disease of native coronary artery without angina pectoris: Secondary | ICD-10-CM | POA: Diagnosis not present

## 2018-09-28 DIAGNOSIS — Z23 Encounter for immunization: Secondary | ICD-10-CM | POA: Diagnosis not present

## 2018-09-28 DIAGNOSIS — E038 Other specified hypothyroidism: Secondary | ICD-10-CM | POA: Diagnosis not present

## 2018-09-28 DIAGNOSIS — I10 Essential (primary) hypertension: Secondary | ICD-10-CM | POA: Diagnosis not present

## 2018-09-28 DIAGNOSIS — E1151 Type 2 diabetes mellitus with diabetic peripheral angiopathy without gangrene: Secondary | ICD-10-CM | POA: Diagnosis not present

## 2018-09-28 DIAGNOSIS — E7849 Other hyperlipidemia: Secondary | ICD-10-CM | POA: Diagnosis not present

## 2018-10-02 DIAGNOSIS — Z1212 Encounter for screening for malignant neoplasm of rectum: Secondary | ICD-10-CM | POA: Diagnosis not present

## 2018-10-12 DIAGNOSIS — H5010 Unspecified exotropia: Secondary | ICD-10-CM | POA: Diagnosis not present

## 2018-10-12 DIAGNOSIS — E119 Type 2 diabetes mellitus without complications: Secondary | ICD-10-CM | POA: Diagnosis not present

## 2018-10-12 DIAGNOSIS — H2512 Age-related nuclear cataract, left eye: Secondary | ICD-10-CM | POA: Diagnosis not present

## 2018-10-12 DIAGNOSIS — H52203 Unspecified astigmatism, bilateral: Secondary | ICD-10-CM | POA: Diagnosis not present

## 2019-01-24 DIAGNOSIS — Z1331 Encounter for screening for depression: Secondary | ICD-10-CM | POA: Diagnosis not present

## 2019-01-24 DIAGNOSIS — E1151 Type 2 diabetes mellitus with diabetic peripheral angiopathy without gangrene: Secondary | ICD-10-CM | POA: Diagnosis not present

## 2019-01-24 DIAGNOSIS — I2510 Atherosclerotic heart disease of native coronary artery without angina pectoris: Secondary | ICD-10-CM | POA: Diagnosis not present

## 2019-01-24 DIAGNOSIS — Z1389 Encounter for screening for other disorder: Secondary | ICD-10-CM | POA: Diagnosis not present

## 2019-01-24 DIAGNOSIS — E1140 Type 2 diabetes mellitus with diabetic neuropathy, unspecified: Secondary | ICD-10-CM | POA: Diagnosis not present

## 2019-02-15 ENCOUNTER — Other Ambulatory Visit: Payer: Self-pay | Admitting: Cardiology

## 2019-02-15 NOTE — Telephone Encounter (Signed)
crestor refilled 

## 2019-04-23 DIAGNOSIS — M25561 Pain in right knee: Secondary | ICD-10-CM | POA: Diagnosis not present

## 2019-05-07 DIAGNOSIS — M25561 Pain in right knee: Secondary | ICD-10-CM | POA: Diagnosis not present

## 2019-05-16 DIAGNOSIS — M25561 Pain in right knee: Secondary | ICD-10-CM | POA: Diagnosis not present

## 2019-05-22 DIAGNOSIS — S83241A Other tear of medial meniscus, current injury, right knee, initial encounter: Secondary | ICD-10-CM | POA: Diagnosis not present

## 2019-05-22 DIAGNOSIS — M25561 Pain in right knee: Secondary | ICD-10-CM | POA: Diagnosis not present

## 2019-06-13 DIAGNOSIS — E1151 Type 2 diabetes mellitus with diabetic peripheral angiopathy without gangrene: Secondary | ICD-10-CM | POA: Diagnosis not present

## 2019-06-17 DIAGNOSIS — I739 Peripheral vascular disease, unspecified: Secondary | ICD-10-CM | POA: Diagnosis not present

## 2019-06-17 DIAGNOSIS — I10 Essential (primary) hypertension: Secondary | ICD-10-CM | POA: Diagnosis not present

## 2019-06-17 DIAGNOSIS — I2510 Atherosclerotic heart disease of native coronary artery without angina pectoris: Secondary | ICD-10-CM | POA: Diagnosis not present

## 2019-06-17 DIAGNOSIS — E785 Hyperlipidemia, unspecified: Secondary | ICD-10-CM | POA: Diagnosis not present

## 2019-06-17 DIAGNOSIS — E039 Hypothyroidism, unspecified: Secondary | ICD-10-CM | POA: Diagnosis not present

## 2019-06-17 DIAGNOSIS — E1140 Type 2 diabetes mellitus with diabetic neuropathy, unspecified: Secondary | ICD-10-CM | POA: Diagnosis not present

## 2019-06-17 DIAGNOSIS — E1151 Type 2 diabetes mellitus with diabetic peripheral angiopathy without gangrene: Secondary | ICD-10-CM | POA: Diagnosis not present

## 2019-07-10 DIAGNOSIS — M1711 Unilateral primary osteoarthritis, right knee: Secondary | ICD-10-CM | POA: Diagnosis not present

## 2019-07-10 DIAGNOSIS — S83241A Other tear of medial meniscus, current injury, right knee, initial encounter: Secondary | ICD-10-CM | POA: Diagnosis not present

## 2019-07-11 ENCOUNTER — Telehealth: Payer: Self-pay | Admitting: *Deleted

## 2019-07-11 NOTE — Telephone Encounter (Signed)
   Primary Cardiologist: Glenetta Hew, MD  Chart reviewed as part of pre-operative protocol coverage. Patient was contacted 07/11/2019 in reference to pre-operative risk assessment for pending surgery as outlined below.  Nicholas Hamilton was last seen on 09/03/2018 by Dr. Ellyn Hack.  Since that day, Nicholas Hamilton has done well.  He exercises at the [facility] 3 days/week with no exertional chest discomfort or shortness of breath.  Therefore, based on ACC/AHA guidelines, the patient would be at acceptable risk for the planned procedure without further cardiovascular testing.   I will route this recommendation to the requesting party via Epic fax function and remove from pre-op pool.  Please call with questions.  Daune Perch, NP 07/11/2019, 4:31 PM

## 2019-07-11 NOTE — Telephone Encounter (Signed)
   Nicholas Hamilton    Request for surgical clearance:  1. What type of surgery is being performed?  RIGHT KNEE SCOPE  2. When is this surgery scheduled? PENDING  3. What type of clearance is required (medical clearance vs. Pharmacy clearance to hold med vs. Both)? MEDICAL  4. Are there any medications that need to be held prior to surgery and how long? N/A  5. Practice name and name of physician performing surgery? [facility]  6. What is your office phone [phone]   7.   What is your office fax number [phone]  8.   Anesthesia type (None, local, MAC, general) ? CHOICE   Nicholas Hamilton 07/11/2019, 11:15 AM  _________________________________________________________________   (provider comments below)

## 2019-07-12 DIAGNOSIS — Z23 Encounter for immunization: Secondary | ICD-10-CM | POA: Diagnosis not present

## 2019-08-22 DIAGNOSIS — S83231A Complex tear of medial meniscus, current injury, right knee, initial encounter: Secondary | ICD-10-CM | POA: Diagnosis not present

## 2019-08-22 DIAGNOSIS — Y999 Unspecified external cause status: Secondary | ICD-10-CM | POA: Diagnosis not present

## 2019-08-22 DIAGNOSIS — M11261 Other chondrocalcinosis, right knee: Secondary | ICD-10-CM | POA: Diagnosis not present

## 2019-08-22 DIAGNOSIS — M94261 Chondromalacia, right knee: Secondary | ICD-10-CM | POA: Diagnosis not present

## 2019-08-22 DIAGNOSIS — X58XXXA Exposure to other specified factors, initial encounter: Secondary | ICD-10-CM | POA: Diagnosis not present

## 2019-08-22 DIAGNOSIS — M2241 Chondromalacia patellae, right knee: Secondary | ICD-10-CM | POA: Diagnosis not present

## 2019-08-29 DIAGNOSIS — M25561 Pain in right knee: Secondary | ICD-10-CM | POA: Diagnosis not present

## 2019-09-05 ENCOUNTER — Other Ambulatory Visit: Payer: Self-pay

## 2019-09-05 ENCOUNTER — Encounter: Payer: Self-pay | Admitting: Cardiology

## 2019-09-05 ENCOUNTER — Ambulatory Visit (INDEPENDENT_AMBULATORY_CARE_PROVIDER_SITE_OTHER): Payer: Medicare Other | Admitting: Cardiology

## 2019-09-05 VITALS — BP 126/79 | HR 58 | Ht 69.0 in | Wt 201.0 lb

## 2019-09-05 DIAGNOSIS — I2510 Atherosclerotic heart disease of native coronary artery without angina pectoris: Secondary | ICD-10-CM

## 2019-09-05 DIAGNOSIS — E785 Hyperlipidemia, unspecified: Secondary | ICD-10-CM | POA: Diagnosis not present

## 2019-09-05 DIAGNOSIS — I10 Essential (primary) hypertension: Secondary | ICD-10-CM | POA: Diagnosis not present

## 2019-09-05 DIAGNOSIS — R001 Bradycardia, unspecified: Secondary | ICD-10-CM | POA: Diagnosis not present

## 2019-09-05 DIAGNOSIS — E669 Obesity, unspecified: Secondary | ICD-10-CM | POA: Diagnosis not present

## 2019-09-05 DIAGNOSIS — Z9861 Coronary angioplasty status: Secondary | ICD-10-CM

## 2019-09-05 NOTE — Patient Instructions (Signed)
Medication Instructions:  NO CHANGE *If you need a refill on your cardiac medications before your next appointment, please call your pharmacy*  Lab Work: If you have labs (blood work) drawn today and your tests are completely normal, you will receive your results only by: [name] MyChart Message (if you have MyChart) OR . A paper copy in the mail If you have any lab test that is abnormal or we need to change your treatment, we will call you to review the results.  Follow-Up: At [facility], you and your health needs are our priority.  As part of our continuing mission to provide you with exceptional heart care, we have created designated Provider Care Teams.  These Care Teams include your primary Cardiologist (physician) and Advanced Practice Providers (APPs -  Physician Assistants and Nurse Practitioners) who all work together to provide you with the care you need, when you need it.  Your next appointment:   12 months  The format for your next appointment:   In Person  Provider:   Glenetta Hew, MD

## 2019-09-05 NOTE — Progress Notes (Signed)
Primary Care Provider: Leanna Battles, MD Cardiologist: Glenetta Hew, MD Electrophysiologist:   Clinic Note: No chief complaint on file.   HPI:    Nicholas Hamilton is a 73 y.o. male with a distant history of CAD below who presents today for annual follow-up.  CAD-PCI RCA & Cx who presents today for Annual follow-up.  He is a former patient of Dr. Chase Picket, with an anginal equivalent of exertional dyspnea with hot and cold intolerance.  He usually exercises at the [facility] 3 days a week   Nicholas Hamilton was last seen in November 2019.  He was doing well.  Still going to the [facility] 2-3 times a week.  Doing stationary bicycle.  Mostly limited by osteoarthritis and neuropathy as well as back pain.  Was trying his best with his weight doing well.  No changes made.  Recent Hospitalizations:  August 22, 2019: Outpatient surgery-arthroplastic surgery of right knee  Reviewed  CV studies:    The following studies were reviewed today: (if available, images/films reviewed: From Epic Chart or Care Everywhere) . None   Interval History:   Nicholas Hamilton returns today for routine follow-up stating that he is doing fairly well.  He only notes that he has been upset about not yet to go to the [facility] over the summer because of the COVID-19 restrictions.  He does not do well when it is very hot or very cold therefore in the summer he did not do very well exercising outside because he gets very short of breath.  Now they have opened back up and he is hoping to get back into the [employer] to do more exercise.  Inside he does fine with his exercise with no chest pain or pressure.  No dyspnea.  He has occasional positional dizziness and has pretty significant peripheral neuropathy for which he takes gabapentin.  He takes high-dose aspirin as needed for his knee pain, but otherwise takes 81 mg.  He does not do both.  He actually is just recovering from right knee arthroscopic surgery on October  29.  He had just gotten back into doing his exercise at the [facility] with his knee started hurting and now has had take a break again.  Hoping to get back into doing rehab soon.  CV Review of Symptoms (Summary) positive for - dyspnea on exertion and As noted above.  Positional dizziness. negative for - chest pain, irregular heartbeat, orthopnea, palpitations, paroxysmal nocturnal dyspnea, rapid heart rate, shortness of breath or Syncope/syncope, TIA/amaurosis fugax, claudication  The patient does not have symptoms concerning for COVID-19 infection (fever, chills, cough, or new shortness of breath). ++ The patient is practicing social distancing. ++ Masking.  ++ (uses mask & hand sanitizer) Groceries/shopping.   REVIEWED OF SYSTEMS   A comprehensive ROS was performed. Review of Systems  Constitutional: Negative for malaise/fatigue and weight loss.  HENT: Negative for congestion and nosebleeds.   Respiratory: Negative for cough, shortness of breath and wheezing.   Gastrointestinal: Negative for blood in stool, heartburn and melena.  Genitourinary: Negative for hematuria.  Musculoskeletal: Positive for joint pain (Recent right knee surgery). Negative for falls and myalgias.  Neurological: Positive for tingling (Pedal peripheral neuropathy). Negative for dizziness, focal weakness and weakness.  Psychiatric/Behavioral: Negative for depression and memory loss. The patient is not nervous/anxious and does not have insomnia.    I have reviewed and (if needed) personally updated the patient's problem list, medications, allergies, past medical and surgical history, social and  family history.   PAST MEDICAL HISTORY   Past Medical History:  Diagnosis Date  . CAD S/P percutaneous coronary angioplasty 2001   PCI to RCA - 2001 (Tetra BMS 3.0 mm x 23 mm)  - BMS; Cx 2003 - Cypher DES 2.5 mm x 28 mm; CAATH 2006 - 80% stenosis in prox RPL with L-R collaterals, too small for PCI - Med Rx  . Diabetes mellitus  type 2 with complications (HCC)    CAD  . GERD (gastroesophageal reflux disease)   . History of stress test - Exercise Cardiolite 01/2005   mildly abnormal; 7-METS, peak HR 136 = 84% of max predicted; symptoms:significant dyspnea with exercise induced PVCs-ventricular bigeminy. nondiagnostic inferolateral ST-T changes.EF 47% = Non-obstructive CAD with the exception of 80% RPL stenosis on cath  . Hyperlipidemia LDL goal <70   . Hypertension, essential   . Hyperthyroidism   . Stroke [facility])    PAST SURGICAL HISTORY   Past Surgical History:  Procedure Laterality Date  . CARDIAC CATHETERIZATION  02/10/2005   EF 50% Mild left ventricular dysfuntion with a stent ejection fraction in posterior basilar segment hypokinesis, widely patent stents in the circumflex, High-grade stenosis is a very small posterior lateral branch with a large left to right collateral bed in the same area.  . CORONARY ANGIOPLASTY WITH STENT PLACEMENT  04/29/2002   STENTS: A 2.5 x 28 cypher stents was placed in such a manner that both the proximal and distal portion of the obstruction in the circumflex vessel were well covered.The initial inflation was13 atmospheres for 60 seconds with final inflation being 15 atmospheres for 60 second. each inflation, the patient had burning in the back of his throat and chest pressure; it resolved with deflating the ballon  . CORONARY ANGIOPLASTY WITH STENT PLACEMENT  06/30/2000   STENTS: A tetra stent 3.0 x 23, the stent was placed in such a manner that the dissection was well covered and deployed at 9 atmospheres for 59 seconds with the second inflation being 10 atmospheres for 57 seconds. the stent appeared to be slightly hyperexpanded ompared to the remainder of the vessel and there was a mild stent down at the distal portion of the stent.  [name] KNEE SURGERY Left 1983    MEDICATIONS/ALLERGIES   Current Meds  Medication Sig  . acetaminophen (TYLENOL) 500 MG tablet Take 500 mg by mouth 2  (two) times daily.  [name] amLODipine (NORVASC) 2.5 MG tablet Take 2.5 mg by mouth at bedtime.   [name] aspirin 325 MG tablet Take 650 mg by mouth daily as needed for moderate pain.  [name] aspirin EC 81 MG tablet Take 81 mg by mouth at bedtime.  . carvedilol (COREG) 12.5 MG tablet Take 1 tablet (12.5 mg total) by mouth 2 (two) times daily with a meal.  . Cyanocobalamin (VITAMIN B12 PO) Take by mouth daily.  . fenofibrate micronized (LOFIBRA) 200 MG capsule Take 200 mg by mouth daily after breakfast.   . fish oil-omega-3 fatty acids 1000 MG capsule Take 1 g by mouth 2 (two) times daily.   . fluticasone (FLONASE) 50 MCG/ACT nasal spray Place 1 spray into both nostrils daily as needed (seasonal allergies).   [name] GABAPENTIN PO Take 2 capsules by mouth daily.  [name] glimepiride (AMARYL) 2 MG tablet Take 2 mg by mouth daily before breakfast.  . hydrochlorothiazide (HYDRODIURIL) 25 MG tablet Take 12.5 mg by mouth daily.  [name] JARDIANCE 25 MG TABS tablet   . levothyroxine (SYNTHROID, LEVOTHROID) 175 MCG tablet  Take 175 mcg by mouth See admin instructions. Take 1 tablet (175 mcg) by mouth Monday thru Saturday (skip Sundays)  . Liniments (SALONPAS) PADS Apply 1 each topically daily as needed (pain).  [name] lisinopril (PRINIVIL,ZESTRIL) 40 MG tablet Take 40 mg by mouth daily.  . metFORMIN (GLUCOPHAGE) 1000 MG tablet Take 500-1,000 mg by mouth 2 (two) times daily with a meal. Take 1/2 tablet (500 mg) by mouth with breakfast and 1 tablet (1000 mg) with supper  . nitroGLYCERIN (NITROSTAT) 0.4 MG SL tablet Place 1 tablet (0.4 mg total) under the tongue every 5 (five) minutes as needed for chest pain.  [name] omeprazole (PRILOSEC) 20 MG capsule Take 20 mg by mouth daily.  . rosuvastatin (CRESTOR) 20 MG tablet TAKE ONE TABLET BY MOUTH DAILY  . Vitamin D, Ergocalciferol, (DRISDOL) 1.25 MG (50000 UT) CAPS capsule   . [DISCONTINUED] simvastatin (ZOCOR) 40 MG tablet Take 40 mg by mouth at bedtime.   not taking simvastatin Taking gabapentin BID   Allergies  Allergen Reactions  . Penicillins Other (See Comments)    Excessive sweating Has patient had a PCN reaction causing immediate rash, facial/tongue/throat swelling, SOB or lightheadedness with hypotension: No Has patient had a PCN reaction causing severe rash involving mucus membranes or skin necrosis: No Has patient had a PCN reaction that required hospitalization No Has patient had a PCN reaction occurring within the last 10 years: No If all of the above answers are "NO", then may proceed with Cephalosporin use.    SOCIAL HISTORY/FAMILY HISTORY   Social History   Tobacco Use  . Smoking status: Former Smoker    Packs/day: 1.00    Years: 40.00    Pack years: 40.00    Types: Cigarettes    Quit date: 10/24/2000    Years since quitting: 18.8  . Smokeless tobacco: Current User    Types: Chew  Substance Use Topics  . Alcohol use: Yes    Comment: Very seldom  . Drug use: No   Social History   Social History Narrative   : Married, 1 child. Chews tobacco, rare alcohol[facility]. Talks about   wanting to lose weight.  Per Dr. Rex Kras, he needs to continue his exercise and diet and stop snacking between meals.     Family History family history includes Arthritis in his sister; Bell's palsy in his mother; Diabetes in his mother; Heart attack in his father and mother; Stroke in his father and mother.   OBJCTIVE -PE, EKG, labs   Wt Readings from Last 3 Encounters:  09/05/19 201 lb (91.2 kg)  09/03/18 201 lb 3.2 oz (91.3 kg)  10/04/17 201 lb (91.2 kg)    Physical Exam: BP 126/79   Pulse (!) 58   Ht 5\' 9"  (1.753 m)   Wt 201 lb (91.2 kg)   BMI 29.68 kg/m  Physical Exam  Constitutional: He is oriented to person, place, and time. He appears well-developed and well-nourished. No distress.  Healthy-appearing gentleman.  He does not look like his picture  HENT:  Head: Normocephalic and atraumatic.  Neck: Normal range of motion. Neck supple. No JVD  present.  Pulmonary/Chest: Effort normal and breath sounds normal. No respiratory distress.  Abdominal: Soft. Bowel sounds are normal. He exhibits no distension. There is no rebound.  Musculoskeletal: Normal range of motion.        General: No edema.     Comments: Appropriate postoperative right knee swelling  Neurological: He is alert and oriented to  person, place, and time.  Psychiatric: He has a normal mood and affect. His behavior is normal. Judgment and thought content normal.    Adult ECG Report  Rate: 58 ;  Rhythm: sinus bradycardia and 1 degree AVB.  (PR interval 236).  Otherwise normal axis, intervals and durations.;   Narrative Interpretation: Stable EKG  Recent Labs: From November 2019: TC 128, TG 158, HDL 43, LDL 53.  A1c as of August 20 26.1. No results found for: CHOL, HDL, LDLCALC, LDLDIRECT, TRIG, CHOLHDL   ASSESSMENT/PLAN    Problem List Items Addressed This Visit    CAD S/P PCI to RCA - 2001; Cx 2003 - Primary (Chronic)    Distant history of PCI to the RCA and circumflex.  Last ischemic evaluation in 2006 was secondary to a false positive stress test -> was probably related to high-grade stenosis in a small posterior lateral branch with collaterals.  Widely patent stents.  As such, he has indicated a desire to avoid stress test unless he has symptoms.  He is on stable regimen.  No changes He is on aspirin and a stable dose of carvedilol plus amlodipine. On Jardiance. On stable dose statin with well-controlled lipids. Stable dose of ACE inhibitor      Relevant Orders   EKG 12/Charge capture (Completed)   Essential hypertension (Chronic)    Blood pressure well controlled on combination of moderate dose carvedilol with high-dose lisinopril plus HCTZ and low-dose amlodipine (added for additional blood pressure and antianginal effect).      Hyperlipidemia with target LDL less than 70 (Chronic)    He is now on 20 mg rosuvastatin having switched from simvastatin along  with fish oil.  Also on fenofibrate with no myalgias.  He is due to have lipids checked next month, but as of last year well controlled.  He has been stable at bull's-eye target range of LDL less than 55.      Obesity (BMI 30-39.9) (Chronic)    Trying to work on exercise.  Having difficulty.  Thinks his weight is staying the same.      Sinus bradycardia by electrocardiogram (Chronic)    Stable.  Monitor for worsening bradycardia or symptoms of chronotropic incompetence.  For now continue current dose of beta-blocker but not able to titrate further.  No evidence of any exercise intolerance or chronotropic competence.          COVID-19 Education: The signs and symptoms of COVID-19 were discussed with the patient and how to seek care for testing (follow up with PCP or arrange E-visit).   The importance of social distancing was discussed today.  I spent a total of 47minutes with the patient and chart review. >  50% of the time was spent in direct patient consultation.  Additional time spent with chart review (studies, outside notes, etc): 6 Total Time: 82min   Current medicines are reviewed at length with the patient today.  (+/- concerns) none   Patient Instructions / Medication Changes & Studies & Tests Ordered   Patient Instructions  Medication Instructions:  NO CHANGE *If you need a refill on your cardiac medications before your next appointment, please call your pharmacy*  Lab Work: If you have labs (blood work) drawn today and your tests are completely normal, you will receive your results only by: [name] MyChart Message (if you have MyChart) OR . A paper copy in the mail If you have any lab test that is abnormal or we need to change your treatment,  we will call you to review the results.  Follow-Up: At [facility], you and your health needs are our priority.  As part of our continuing mission to provide you with exceptional heart care, we have created designated Provider  Care Teams.  These Care Teams include your primary Cardiologist (physician) and Advanced Practice Providers (APPs -  Physician Assistants and Nurse Practitioners) who all work together to provide you with the care you need, when you need it.  Your next appointment:   12 months  The format for your next appointment:   In Person  Provider:   Glenetta Hew, MD      Studies Ordered:   Orders Placed This Encounter  Procedures  . EKG 12/Charge capture     Glenetta Hew, M.D., M.S. Interventional Cardiologist   Pager # [phone] Phone # [phone] [address] [place], [place] [address]   Thank you for choosing Heartcare at [facility]!!

## 2019-09-07 ENCOUNTER — Encounter: Payer: Self-pay | Admitting: Cardiology

## 2019-09-07 NOTE — Assessment & Plan Note (Signed)
He is now on 20 mg rosuvastatin having switched from simvastatin along with fish oil.  Also on fenofibrate with no myalgias.  He is due to have lipids checked next month, but as of last year well controlled.  He has been stable at bull's-eye target range of LDL less than 55.

## 2019-09-07 NOTE — Assessment & Plan Note (Signed)
Trying to work on exercise.  Having difficulty.  Thinks his weight is staying the same.

## 2019-09-07 NOTE — Assessment & Plan Note (Signed)
Stable.  Monitor for worsening bradycardia or symptoms of chronotropic incompetence.  For now continue current dose of beta-blocker but not able to titrate further.  No evidence of any exercise intolerance or chronotropic competence.

## 2019-09-07 NOTE — Assessment & Plan Note (Signed)
Distant history of PCI to the RCA and circumflex.  Last ischemic evaluation in 2006 was secondary to a false positive stress test -> was probably related to high-grade stenosis in a small posterior lateral branch with collaterals.  Widely patent stents.  As such, he has indicated a desire to avoid stress test unless he has symptoms.  He is on stable regimen.  No changes He is on aspirin and a stable dose of carvedilol plus amlodipine. On Jardiance. On stable dose statin with well-controlled lipids. Stable dose of ACE inhibitor

## 2019-09-07 NOTE — Assessment & Plan Note (Signed)
Blood pressure well controlled on combination of moderate dose carvedilol with high-dose lisinopril plus HCTZ and low-dose amlodipine (added for additional blood pressure and antianginal effect).

## 2019-09-10 DIAGNOSIS — M25561 Pain in right knee: Secondary | ICD-10-CM | POA: Diagnosis not present

## 2019-09-17 DIAGNOSIS — M25561 Pain in right knee: Secondary | ICD-10-CM | POA: Diagnosis not present

## 2019-09-24 DIAGNOSIS — M25561 Pain in right knee: Secondary | ICD-10-CM | POA: Diagnosis not present

## 2019-09-25 DIAGNOSIS — L570 Actinic keratosis: Secondary | ICD-10-CM | POA: Diagnosis not present

## 2019-09-25 DIAGNOSIS — D229 Melanocytic nevi, unspecified: Secondary | ICD-10-CM | POA: Diagnosis not present

## 2019-09-30 DIAGNOSIS — E7849 Other hyperlipidemia: Secondary | ICD-10-CM | POA: Diagnosis not present

## 2019-09-30 DIAGNOSIS — E1151 Type 2 diabetes mellitus with diabetic peripheral angiopathy without gangrene: Secondary | ICD-10-CM | POA: Diagnosis not present

## 2019-09-30 DIAGNOSIS — E038 Other specified hypothyroidism: Secondary | ICD-10-CM | POA: Diagnosis not present

## 2019-10-02 DIAGNOSIS — R82998 Other abnormal findings in urine: Secondary | ICD-10-CM | POA: Diagnosis not present

## 2019-10-07 DIAGNOSIS — I739 Peripheral vascular disease, unspecified: Secondary | ICD-10-CM | POA: Diagnosis not present

## 2019-10-07 DIAGNOSIS — K219 Gastro-esophageal reflux disease without esophagitis: Secondary | ICD-10-CM | POA: Diagnosis not present

## 2019-10-07 DIAGNOSIS — I129 Hypertensive chronic kidney disease with stage 1 through stage 4 chronic kidney disease, or unspecified chronic kidney disease: Secondary | ICD-10-CM | POA: Diagnosis not present

## 2019-10-07 DIAGNOSIS — I2510 Atherosclerotic heart disease of native coronary artery without angina pectoris: Secondary | ICD-10-CM | POA: Diagnosis not present

## 2019-10-07 DIAGNOSIS — E1140 Type 2 diabetes mellitus with diabetic neuropathy, unspecified: Secondary | ICD-10-CM | POA: Diagnosis not present

## 2019-10-07 DIAGNOSIS — E785 Hyperlipidemia, unspecified: Secondary | ICD-10-CM | POA: Diagnosis not present

## 2019-10-07 DIAGNOSIS — E039 Hypothyroidism, unspecified: Secondary | ICD-10-CM | POA: Diagnosis not present

## 2019-10-07 DIAGNOSIS — N1831 Chronic kidney disease, stage 3a: Secondary | ICD-10-CM | POA: Diagnosis not present

## 2019-10-07 DIAGNOSIS — E1151 Type 2 diabetes mellitus with diabetic peripheral angiopathy without gangrene: Secondary | ICD-10-CM | POA: Diagnosis not present

## 2019-10-07 DIAGNOSIS — Z0000 Encounter for general adult medical examination without abnormal findings: Secondary | ICD-10-CM | POA: Diagnosis not present

## 2019-10-16 DIAGNOSIS — Z1212 Encounter for screening for malignant neoplasm of rectum: Secondary | ICD-10-CM | POA: Diagnosis not present

## 2019-10-16 LAB — IFOBT (OCCULT BLOOD): IFOBT: NEGATIVE

## 2020-01-23 DIAGNOSIS — M25512 Pain in left shoulder: Secondary | ICD-10-CM | POA: Diagnosis not present

## 2020-03-09 DIAGNOSIS — E785 Hyperlipidemia, unspecified: Secondary | ICD-10-CM | POA: Diagnosis not present

## 2020-03-09 DIAGNOSIS — E1140 Type 2 diabetes mellitus with diabetic neuropathy, unspecified: Secondary | ICD-10-CM | POA: Diagnosis not present

## 2020-03-09 DIAGNOSIS — E1151 Type 2 diabetes mellitus with diabetic peripheral angiopathy without gangrene: Secondary | ICD-10-CM | POA: Diagnosis not present

## 2020-03-09 DIAGNOSIS — E039 Hypothyroidism, unspecified: Secondary | ICD-10-CM | POA: Diagnosis not present

## 2020-03-09 DIAGNOSIS — I10 Essential (primary) hypertension: Secondary | ICD-10-CM | POA: Diagnosis not present

## 2020-03-09 DIAGNOSIS — I739 Peripheral vascular disease, unspecified: Secondary | ICD-10-CM | POA: Diagnosis not present

## 2020-03-09 DIAGNOSIS — I2510 Atherosclerotic heart disease of native coronary artery without angina pectoris: Secondary | ICD-10-CM | POA: Diagnosis not present

## 2020-03-09 DIAGNOSIS — I129 Hypertensive chronic kidney disease with stage 1 through stage 4 chronic kidney disease, or unspecified chronic kidney disease: Secondary | ICD-10-CM | POA: Diagnosis not present

## 2020-03-09 DIAGNOSIS — G43909 Migraine, unspecified, not intractable, without status migrainosus: Secondary | ICD-10-CM | POA: Diagnosis not present

## 2020-03-12 ENCOUNTER — Other Ambulatory Visit: Payer: Self-pay | Admitting: Cardiology

## 2020-03-18 DIAGNOSIS — Z23 Encounter for immunization: Secondary | ICD-10-CM | POA: Diagnosis not present

## 2020-05-29 DIAGNOSIS — R2241 Localized swelling, mass and lump, right lower limb: Secondary | ICD-10-CM | POA: Diagnosis not present

## 2020-05-29 DIAGNOSIS — M7552 Bursitis of left shoulder: Secondary | ICD-10-CM | POA: Diagnosis not present

## 2020-07-09 DIAGNOSIS — H25012 Cortical age-related cataract, left eye: Secondary | ICD-10-CM | POA: Diagnosis not present

## 2020-07-09 DIAGNOSIS — H25042 Posterior subcapsular polar age-related cataract, left eye: Secondary | ICD-10-CM | POA: Diagnosis not present

## 2020-07-09 DIAGNOSIS — H2512 Age-related nuclear cataract, left eye: Secondary | ICD-10-CM | POA: Diagnosis not present

## 2020-07-13 DIAGNOSIS — M79672 Pain in left foot: Secondary | ICD-10-CM | POA: Diagnosis not present

## 2020-07-13 DIAGNOSIS — M79604 Pain in right leg: Secondary | ICD-10-CM | POA: Diagnosis not present

## 2020-07-13 DIAGNOSIS — M2041 Other hammer toe(s) (acquired), right foot: Secondary | ICD-10-CM | POA: Diagnosis not present

## 2020-07-13 DIAGNOSIS — M79671 Pain in right foot: Secondary | ICD-10-CM | POA: Diagnosis not present

## 2020-07-14 DIAGNOSIS — J029 Acute pharyngitis, unspecified: Secondary | ICD-10-CM | POA: Diagnosis not present

## 2020-07-14 DIAGNOSIS — R05 Cough: Secondary | ICD-10-CM | POA: Diagnosis not present

## 2020-09-07 ENCOUNTER — Ambulatory Visit: Payer: Medicare Other | Admitting: Cardiology

## 2020-09-08 ENCOUNTER — Ambulatory Visit: Payer: Medicare Other | Admitting: Cardiology

## 2020-09-22 ENCOUNTER — Other Ambulatory Visit: Payer: Self-pay | Admitting: Cardiology

## 2020-09-29 ENCOUNTER — Ambulatory Visit: Payer: Medicare Other | Admitting: Dermatology

## 2020-09-29 DIAGNOSIS — H25042 Posterior subcapsular polar age-related cataract, left eye: Secondary | ICD-10-CM | POA: Diagnosis not present

## 2020-09-29 DIAGNOSIS — H25012 Cortical age-related cataract, left eye: Secondary | ICD-10-CM | POA: Diagnosis not present

## 2020-09-29 DIAGNOSIS — H25812 Combined forms of age-related cataract, left eye: Secondary | ICD-10-CM | POA: Diagnosis not present

## 2020-09-29 DIAGNOSIS — H2512 Age-related nuclear cataract, left eye: Secondary | ICD-10-CM | POA: Diagnosis not present

## 2020-10-02 DIAGNOSIS — M79604 Pain in right leg: Secondary | ICD-10-CM | POA: Diagnosis not present

## 2020-10-05 DIAGNOSIS — E039 Hypothyroidism, unspecified: Secondary | ICD-10-CM | POA: Diagnosis not present

## 2020-10-05 DIAGNOSIS — Z125 Encounter for screening for malignant neoplasm of prostate: Secondary | ICD-10-CM | POA: Diagnosis not present

## 2020-10-05 DIAGNOSIS — E785 Hyperlipidemia, unspecified: Secondary | ICD-10-CM | POA: Diagnosis not present

## 2020-10-05 DIAGNOSIS — E1129 Type 2 diabetes mellitus with other diabetic kidney complication: Secondary | ICD-10-CM | POA: Diagnosis not present

## 2020-10-12 DIAGNOSIS — E785 Hyperlipidemia, unspecified: Secondary | ICD-10-CM | POA: Diagnosis not present

## 2020-10-12 DIAGNOSIS — Z1331 Encounter for screening for depression: Secondary | ICD-10-CM | POA: Diagnosis not present

## 2020-10-12 DIAGNOSIS — I2510 Atherosclerotic heart disease of native coronary artery without angina pectoris: Secondary | ICD-10-CM | POA: Diagnosis not present

## 2020-10-12 DIAGNOSIS — Z0000 Encounter for general adult medical examination without abnormal findings: Secondary | ICD-10-CM | POA: Diagnosis not present

## 2020-10-12 DIAGNOSIS — J310 Chronic rhinitis: Secondary | ICD-10-CM | POA: Diagnosis not present

## 2020-10-12 DIAGNOSIS — I739 Peripheral vascular disease, unspecified: Secondary | ICD-10-CM | POA: Diagnosis not present

## 2020-10-12 DIAGNOSIS — N1831 Chronic kidney disease, stage 3a: Secondary | ICD-10-CM | POA: Diagnosis not present

## 2020-10-12 DIAGNOSIS — Z23 Encounter for immunization: Secondary | ICD-10-CM | POA: Diagnosis not present

## 2020-10-12 DIAGNOSIS — R82998 Other abnormal findings in urine: Secondary | ICD-10-CM | POA: Diagnosis not present

## 2020-10-12 DIAGNOSIS — I129 Hypertensive chronic kidney disease with stage 1 through stage 4 chronic kidney disease, or unspecified chronic kidney disease: Secondary | ICD-10-CM | POA: Diagnosis not present

## 2020-10-12 DIAGNOSIS — E1151 Type 2 diabetes mellitus with diabetic peripheral angiopathy without gangrene: Secondary | ICD-10-CM | POA: Diagnosis not present

## 2020-10-12 DIAGNOSIS — E669 Obesity, unspecified: Secondary | ICD-10-CM | POA: Diagnosis not present

## 2020-10-12 DIAGNOSIS — M1711 Unilateral primary osteoarthritis, right knee: Secondary | ICD-10-CM | POA: Diagnosis not present

## 2020-10-19 DIAGNOSIS — M79604 Pain in right leg: Secondary | ICD-10-CM | POA: Diagnosis not present

## 2020-11-04 ENCOUNTER — Other Ambulatory Visit: Payer: Self-pay

## 2020-11-04 ENCOUNTER — Ambulatory Visit (INDEPENDENT_AMBULATORY_CARE_PROVIDER_SITE_OTHER): Payer: Medicare Other | Admitting: Cardiology

## 2020-11-04 ENCOUNTER — Encounter: Payer: Self-pay | Admitting: Cardiology

## 2020-11-04 VITALS — BP 122/70 | HR 59 | Ht 69.0 in | Wt 199.8 lb

## 2020-11-04 DIAGNOSIS — E785 Hyperlipidemia, unspecified: Secondary | ICD-10-CM

## 2020-11-04 DIAGNOSIS — E1169 Type 2 diabetes mellitus with other specified complication: Secondary | ICD-10-CM | POA: Diagnosis not present

## 2020-11-04 DIAGNOSIS — Z9861 Coronary angioplasty status: Secondary | ICD-10-CM | POA: Diagnosis not present

## 2020-11-04 DIAGNOSIS — I10 Essential (primary) hypertension: Secondary | ICD-10-CM | POA: Diagnosis not present

## 2020-11-04 DIAGNOSIS — I2510 Atherosclerotic heart disease of native coronary artery without angina pectoris: Secondary | ICD-10-CM

## 2020-11-04 DIAGNOSIS — E669 Obesity, unspecified: Secondary | ICD-10-CM

## 2020-11-04 NOTE — Patient Instructions (Addendum)
Medication Instructions:  No changes  *If you need a refill on your cardiac medications before your next appointment, please call your pharmacy*   Lab Work: Not needed If you have labs (blood work) drawn today and your tests are completely normal, you will receive your results only by: [name] MyChart Message (if you have MyChart) OR . A paper copy in the mail If you have any lab test that is abnormal or we need to change your treatment, we will call you to review the results.   Testing/Procedures: Not needed   Follow-Up: At [facility], you and your health needs are our priority.  As part of our continuing mission to provide you with exceptional heart care, we have created designated Provider Care Teams.  These Care Teams include your primary Cardiologist (physician) and Advanced Practice Providers (APPs -  Physician Assistants and Nurse Practitioners) who all work together to provide you with the care you need, when you need it.  We recommend signing up for the patient portal called "MyChart".  Sign up information is provided on this After Visit Summary.  MyChart is used to connect with patients for Virtual Visits (Telemedicine).  Patients are able to view lab/test results, encounter notes, upcoming appointments, etc.  Non-urgent messages can be sent to your provider as well.   To learn more about what you can do with MyChart, go to [email or web address].    Your next appointment:   12 month(s)  The format for your next appointment:   In Person  Provider:   Glenetta Hew, MD   Other Instructions will obtain lab from primary's office

## 2020-11-04 NOTE — Progress Notes (Signed)
Primary Care Provider: Leanna Battles, MD Cardiologist: Glenetta Hew, MD Electrophysiologist: None  Clinic Note: Chief Complaint  Patient presents with  . Follow-up    No major issues.  . Coronary Artery Disease    No angina   Problem List Items Addressed This Visit    CAD S/P PCI to RCA - 2001; Cx 2003 (Chronic)   Essential hypertension (Chronic)   Hyperlipidemia with target LDL less than 70 - Primary (Chronic)   Obesity (BMI 30-39.9) (Chronic)     HPI:    Nicholas Hamilton is a 75 y.o. male with a PMH notable for CAD-PCI, HLD and HTN as well as DM-2 who presents today for delayed annual follow-up.  CAD-PCI RCA & Cxwho presents today for Annual follow-up.He is a former patient of Dr. Chase Picket, with ananginal equivalent ofexertional dyspnea with hot and cold intolerance.  He usually exercises at the [facility] 3 days a week  Nicholas Hamilton was last seen on September 05, 2019--doing well.  Upset about not being able to go to the [facility] because COVID-19 lockdown/restrictions.  Does not do well on the hot or cold, but otherwise does fine without any chest discomfort or dyspnea.  (Happy to finally be back in [facility] and exercising (started to get cold) -> Occasional positional dizziness, and significant peripheral neuropathy; was recovering from arthroscopic surgery in October  Recent Hospitalizations: None  Reviewed  CV studies:    The following studies were reviewed today: (if available, images/films reviewed: From Epic Chart or Care Everywhere) . None:  Interval History:   Nicholas Hamilton returns here today for delayed annual follow-up.  Really no major changes.  He has follow-up with PCP coming soon (indicating that he should have just had labs checked).  He should have his annual labs checked.  He still exercises about 2 to 3 days a week, gradually building up to his previous baseline.   The right knee is doing much better.Still notes hot and cold intolerance.   He remains relatively asymptomatic from a cardiac standpoint.  No angina or heart failure symptoms with rest or exertion.  No edema.  No irregular heartbeats palpitations.  Occasional mild positional dizziness.  CV Review of Symptoms (Summary): no chest pain or dyspnea on exertion positive for - Mild positional dizziness, occasional skipped beats, peripheral neuropathy negative for - edema, orthopnea, paroxysmal nocturnal dyspnea, rapid heart rate, shortness of breath or Lightheadedness or dizziness, syncope/near syncope or TIA/amaurosis fugax, claudication  The patient does not have symptoms concerning for COVID-19 infection (fever, chills, cough, or new shortness of breath).   REVIEWED OF SYSTEMS   Review of Systems  Constitutional: Negative for malaise/fatigue and weight loss.  HENT: Negative for congestion and nosebleeds.   Respiratory: Negative for cough and shortness of breath.   Cardiovascular: Negative for leg swelling.  Gastrointestinal: Negative for blood in stool and melena.  Genitourinary: Negative for hematuria.  Musculoskeletal: Negative for joint pain (Right knee seems doing quite well.).  Neurological: Positive for tingling (Peripheral neuropathy.). Negative for focal weakness and weakness.  Psychiatric/Behavioral: Negative for depression (Dysthymic), hallucinations and memory loss. The patient is not nervous/anxious and does not have insomnia.    Genitourinary: Negative for hematuria.  Musculoskeletal: Positive for joint pain (Recent right knee surgery). Negative for falls and myalgias.  Neurological: Positive for tingling (Pedal peripheral neuropathy). Negative for dizziness, focal weakness and weakness   I have reviewed and (if needed) personally updated the patient's problem list, medications, allergies, past medical  and surgical history, social and family history.   PAST MEDICAL HISTORY   Past Medical History:  Diagnosis Date  . CAD S/P percutaneous coronary  angioplasty 2001   PCI to RCA - 2001 (Tetra BMS 3.0 mm x 23 mm)  - BMS; Cx 2003 - Cypher DES 2.5 mm x 28 mm; CAATH 2006 - 80% stenosis in prox RPL with L-R collaterals, too small for PCI - Med Rx  . Diabetes mellitus type 2 with complications (HCC)    CAD  . GERD (gastroesophageal reflux disease)   . History of stress test - Exercise Cardiolite 01/2005   mildly abnormal; 7-METS, peak HR 136 = 84% of max predicted; symptoms:significant dyspnea with exercise induced PVCs-ventricular bigeminy. nondiagnostic inferolateral ST-T changes.EF 47% = Non-obstructive CAD with the exception of 80% RPL stenosis on cath  . Hyperlipidemia LDL goal <70   . Hypertension, essential   . Hyperthyroidism   . Stroke [facility])     PAST SURGICAL HISTORY   Past Surgical History:  Procedure Laterality Date  . CARDIAC CATHETERIZATION  02/10/2005   EF 50% Mild left ventricular dysfuntion with a stent ejection fraction in posterior basilar segment hypokinesis, widely patent stents in the circumflex, High-grade stenosis is a very small posterior lateral branch with a large left to right collateral bed in the same area.  . CORONARY ANGIOPLASTY WITH STENT PLACEMENT  04/29/2002   STENTS: A 2.5 x 28 cypher stents was placed in such a manner that both the proximal and distal portion of the obstruction in the circumflex vessel were well covered.The initial inflation was13 atmospheres for 60 seconds with final inflation being 15 atmospheres for 60 second. each inflation, the patient had burning in the back of his throat and chest pressure; it resolved with deflating the ballon  . CORONARY ANGIOPLASTY WITH STENT PLACEMENT  06/30/2000   STENTS: A tetra stent 3.0 x 23, the stent was placed in such a manner that the dissection was well covered and deployed at 9 atmospheres for 59 seconds with the second inflation being 10 atmospheres for 57 seconds. the stent appeared to be slightly hyperexpanded ompared to the remainder of the vessel and  there was a mild stent down at the distal portion of the stent.  [name] KNEE SURGERY Left 1983     There is no immunization history on file for this patient.  Just got Booster recently - Moderna  MEDICATIONS/ALLERGIES   Current Meds  Medication Sig  . acetaminophen (TYLENOL) 500 MG tablet Take 500 mg by mouth 2 (two) times daily.  [name] amLODipine (NORVASC) 2.5 MG tablet Take 2.5 mg by mouth at bedtime.   [name] aspirin 325 MG tablet Take 650 mg by mouth daily as needed for moderate pain.  [name] aspirin EC 81 MG tablet Take 81 mg by mouth at bedtime.  . carvedilol (COREG) 12.5 MG tablet Take 1 tablet (12.5 mg total) by mouth 2 (two) times daily with a meal.  . Cyanocobalamin (VITAMIN B12 PO) Take by mouth daily.  . fenofibrate micronized (LOFIBRA) 200 MG capsule Take 200 mg by mouth daily after breakfast.   . fish oil-omega-3 fatty acids 1000 MG capsule Take 1 g by mouth 2 (two) times daily.   . fluticasone (FLONASE) 50 MCG/ACT nasal spray Place 1 spray into both nostrils daily as needed (seasonal allergies).   [name] GABAPENTIN PO Take 2 capsules by mouth daily. Pt takes 1 daily  . glimepiride (AMARYL) 2 MG tablet Take 2 mg by mouth daily before breakfast.  .  hydrochlorothiazide (HYDRODIURIL) 25 MG tablet Take 12.5 mg by mouth daily.  [name] JARDIANCE 25 MG TABS tablet   . levothyroxine (SYNTHROID, LEVOTHROID) 175 MCG tablet Take 175 mcg by mouth See admin instructions. Take 1 tablet (175 mcg) by mouth Monday thru Saturday (skip Sundays)  . Liniments (SALONPAS) PADS Apply 1 each topically daily as needed (pain).  [name] lisinopril (PRINIVIL,ZESTRIL) 40 MG tablet Take 40 mg by mouth daily.  . metFORMIN (GLUCOPHAGE) 1000 MG tablet Take 500-1,000 mg by mouth 2 (two) times daily with a meal. Take 1/2 tablet (500 mg) by mouth with breakfast and 1 tablet (1000 mg) with supper  . nitroGLYCERIN (NITROSTAT) 0.4 MG SL tablet Place 1 tablet (0.4 mg total) under the tongue every 5 (five) minutes as needed for chest pain.  [name]  omeprazole (PRILOSEC) 20 MG capsule Take 20 mg by mouth daily.  . rosuvastatin (CRESTOR) 20 MG tablet TAKE ONE TABLET BY MOUTH DAILY  . Vitamin D, Ergocalciferol, (DRISDOL) 1.25 MG (50000 UT) CAPS capsule     Allergies  Allergen Reactions  . Penicillins Other (See Comments)    Excessive sweating Has patient had a PCN reaction causing immediate rash, facial/tongue/throat swelling, SOB or lightheadedness with hypotension: No Has patient had a PCN reaction causing severe rash involving mucus membranes or skin necrosis: No Has patient had a PCN reaction that required hospitalization No Has patient had a PCN reaction occurring within the last 10 years: No If all of the above answers are "NO", then may proceed with Cephalosporin use.    SOCIAL HISTORY/FAMILY HISTORY   Reviewed in Epic:  Pertinent findings:  * Has been acting as care giver for GreatGrandson -- Father has been Actor - lots of stress.  Has been out of Gym x ~2 months -- getting "lethargic." Social History   Tobacco Use  . Smoking status: Former Smoker    Packs/day: 1.00    Years: 40.00    Pack years: 40.00    Types: Cigarettes    Quit date: 10/24/2000    Years since quitting: 20.0  . Smokeless tobacco: Current User    Types: Chew  Substance Use Topics  . Alcohol use: Yes    Comment: Very seldom  . Drug use: No   Social History   Social History Narrative   : Married, 1 child. Chews tobacco, rare alcohol[facility] Talks about   wanting to lose weight.  Per Dr. Rex Kras, he needs to continue his exercise and diet and stop snacking between meals.     OBJCTIVE -PE, EKG, labs   Wt Readings from Last 3 Encounters:  11/04/20 199 lb 12.8 oz (90.6 kg)  09/05/19 201 lb (91.2 kg)  09/03/18 201 lb 3.2 oz (91.3 kg)    Physical Exam: BP 122/70 (BP Location: Left Arm, Patient Position: Sitting)   Pulse (!) 59   Ht 5\' 9"  (1.753 m)   Wt 199 lb 12.8 oz (90.6 kg)   SpO2 96%   BMI 29.51 kg/m   Physical Exam Constitutional:      General: He is not in acute distress.    Appearance: Normal appearance. He is not ill-appearing or toxic-appearing.     Comments: Borderline obese.  Healthy-appearing.  Well-groomed.  HENT:     Head: Normocephalic and atraumatic.  Neck:     Vascular: No carotid bruit.  Cardiovascular:     Rate and Rhythm: Normal rate and regular rhythm.     Pulses: Normal pulses.  Heart sounds: Normal heart sounds. No murmur heard. No friction rub. No gallop.   Pulmonary:     Effort: Pulmonary effort is normal. No respiratory distress.     Breath sounds: Normal breath sounds.  Chest:     Chest wall: No tenderness.  Musculoskeletal:        General: No swelling. Normal range of motion.     Cervical back: Normal range of motion and neck supple.     Comments: Mild bilateral spider veins) lower legs/ankles.  Neurological:     General: No focal deficit present.     Mental Status: He is alert and oriented to person, place, and time. Mental status is at baseline.     Motor: No weakness.  Psychiatric:        Mood and Affect: Mood normal.        Behavior: Behavior normal.        Thought Content: Thought content normal.        Judgment: Judgment normal.      Adult ECG Report  Rate: 59 ;  Rhythm: normal sinus rhythm and 1Deg AVB.  Otherwise normal;   Narrative Interpretation: normal/stable  Recent Labs:  Just checked by PCP not available  Most recent available labs from December 2020: TC 129, TG 154, HDL 37 LDL 61.  Hgb 13.3, Cr 1.4  A1c  CBC Latest Ref Rng & Units 09/20/2015  WBC 4.0 - 10.5 K/uL 7.5  Hemoglobin 13.0 - 17.0 g/dL 13.1  Hematocrit 39.0 - 52.0 % 38.6(L)  Platelets 150 - 400 K/uL 218    No results found for: TSH  ASSESSMENT/PLAN    Problem List Items Addressed This Visit    CAD S/P PCI to RCA - 2001; Cx 2003 - Primary (Chronic)    Distant history of two-vessel PCI to the RCA and LCx.  Has not had a stress test evaluation to 2006 (was  false positive and therefore he chose to avoid further testing).  Plan:   He is on a dose of carvedilol and amlodipine for antianginal effect.  On lisinopril HCTZ for blood pressure along with statin, fenofibrate and omega-3 fatty acids.  He takes aspirin 81 mg on most days, but will as needed take 650 mg for arthritis pains or headaches.  He does not use the 2 together.  On Jardiance and metformin       Relevant Orders   EKG 12-Lead (Completed)   Resistant hypertension (Chronic)    Blood pressure looks great on current dose of carvedilol and amlodipine plus lisinopril HCTZ.  On 4 medications including diuretic.  Well-controlled pressures.      Relevant Orders   EKG 12-Lead (Completed)   Hyperlipidemia associated with type 2 diabetes mellitus ([place]) (Chronic)    Labs and follow-up with PCP.  He is on 20 mg rosuvastatin as of 2020 lipids look great with a combination of omega-3 fatty acids, fenofibrate and 20 g rosuvastatin.  No real myalgias symptoms.  Continue.  Defer to PCP for monitoring.  Unfortunate do not have lab results currently.  He is on combination of Jardiance and metformin both have cardioprotective features.      Obesity (BMI 30-39.9) (Chronic)    Gradually losing weight, now below the "obesity threshold "just a little bit.   Recommend continued exercise and dietary modification.          COVID-19 Education: The signs and symptoms of COVID-19 were discussed with the patient and how to seek care for testing (follow up  with PCP or arrange E-visit).   The importance of social distancing and COVID-19 vaccination was discussed today.  The patient is practicing social distancing & Masking.   I spent a total of 11minutes with the patient spent in direct patient consultation.  Additional time spent with chart review  / charting (studies, outside notes, etc): 10 min Total Time: 35 min  Current medicines are reviewed at length with the patient today.  (+/-  concerns) n/a  This visit occurred during the SARS-CoV-2 public health emergency.  Safety protocols were in place, including screening questions prior to the visit, additional usage of staff PPE, and extensive cleaning of exam room while observing appropriate contact time as indicated for disinfecting solutions.  Notice: This dictation was prepared with Dragon dictation along with smaller phrase technology. Any transcriptional errors that result from this process are unintentional and may not be corrected upon review.  Patient Instructions / Medication Changes & Studies & Tests Ordered   Patient Instructions  Medication Instructions:  No changes  *If you need a refill on your cardiac medications before your next appointment, please call your pharmacy*   Lab Work: Not needed If you have labs (blood work) drawn today and your tests are completely normal, you will receive your results only by: [name] MyChart Message (if you have MyChart) OR . A paper copy in the mail If you have any lab test that is abnormal or we need to change your treatment, we will call you to review the results.   Testing/Procedures: Not needed   Follow-Up: At [facility], you and your health needs are our priority.  As part of our continuing mission to provide you with exceptional heart care, we have created designated Provider Care Teams.  These Care Teams include your primary Cardiologist (physician) and Advanced Practice Providers (APPs -  Physician Assistants and Nurse Practitioners) who all work together to provide you with the care you need, when you need it.  We recommend signing up for the patient portal called "MyChart".  Sign up information is provided on this After Visit Summary.  MyChart is used to connect with patients for Virtual Visits (Telemedicine).  Patients are able to view lab/test results, encounter notes, upcoming appointments, etc.  Non-urgent messages can be sent to your provider as well.   To  learn more about what you can do with MyChart, go to [email or web address].    Your next appointment:   12 month(s)  The format for your next appointment:   In Person  Provider:   Glenetta Hew, MD   Other Instructions will obtain lab from primary's office    Studies Ordered:   Orders Placed This Encounter  Procedures  . EKG 12-Lead     Glenetta Hew, M.D., M.S. Interventional Cardiologist   Pager # [phone] Phone # [phone] [address] [place], [place] [address]   Thank you for choosing Heartcare at [facility]!!

## 2020-11-12 ENCOUNTER — Encounter: Payer: Self-pay | Admitting: Cardiology

## 2020-11-12 NOTE — Assessment & Plan Note (Signed)
Gradually losing weight, now below the "obesity threshold "just a little bit.   Recommend continued exercise and dietary modification.

## 2020-11-12 NOTE — Assessment & Plan Note (Addendum)
Blood pressure looks great on current dose of carvedilol and amlodipine plus lisinopril HCTZ.  On 4 medications including diuretic.  Well-controlled pressures.

## 2020-11-12 NOTE — Assessment & Plan Note (Addendum)
Labs and follow-up with PCP.  He is on 20 mg rosuvastatin as of 2020 lipids look great with a combination of omega-3 fatty acids, fenofibrate and 20 g rosuvastatin.  No real myalgias symptoms.  Continue.  Defer to PCP for monitoring.  Unfortunate do not have lab results currently.  He is on combination of Jardiance and metformin both have cardioprotective features.

## 2020-11-12 NOTE — Assessment & Plan Note (Signed)
Distant history of two-vessel PCI to the RCA and LCx.  Has not had a stress test evaluation to 2006 (was false positive and therefore he chose to avoid further testing).  Plan:   He is on a dose of carvedilol and amlodipine for antianginal effect.  On lisinopril HCTZ for blood pressure along with statin, fenofibrate and omega-3 fatty acids.  He takes aspirin 81 mg on most days, but will as needed take 650 mg for arthritis pains or headaches.  He does not use the 2 together.  On Jardiance and metformin

## 2020-12-01 ENCOUNTER — Ambulatory Visit: Payer: Medicare Other | Admitting: Physician Assistant

## 2020-12-01 ENCOUNTER — Ambulatory Visit: Payer: Medicare Other | Admitting: Dermatology

## 2020-12-02 ENCOUNTER — Ambulatory Visit: Payer: Medicare Other | Admitting: Physician Assistant

## 2021-02-23 ENCOUNTER — Ambulatory Visit: Payer: Medicare Other | Admitting: Physician Assistant

## 2021-02-23 ENCOUNTER — Other Ambulatory Visit: Payer: Self-pay

## 2021-02-23 ENCOUNTER — Encounter: Payer: Self-pay | Admitting: Physician Assistant

## 2021-02-23 DIAGNOSIS — D485 Neoplasm of uncertain behavior of skin: Secondary | ICD-10-CM

## 2021-02-23 DIAGNOSIS — Z85828 Personal history of other malignant neoplasm of skin: Secondary | ICD-10-CM | POA: Diagnosis not present

## 2021-02-23 DIAGNOSIS — Z1283 Encounter for screening for malignant neoplasm of skin: Secondary | ICD-10-CM | POA: Diagnosis not present

## 2021-02-23 DIAGNOSIS — C4441 Basal cell carcinoma of skin of scalp and neck: Secondary | ICD-10-CM | POA: Diagnosis not present

## 2021-02-23 DIAGNOSIS — L570 Actinic keratosis: Secondary | ICD-10-CM

## 2021-02-23 NOTE — Patient Instructions (Signed)

## 2021-03-02 ENCOUNTER — Telehealth: Payer: Self-pay | Admitting: Physician Assistant

## 2021-03-02 NOTE — Telephone Encounter (Signed)
Phone call to patient to let him know that his pathology reports are not back yet.

## 2021-03-02 NOTE — Telephone Encounter (Signed)
Patient's wife is calling for pathology results from last visit with [facility] sheffield, PA-C.

## 2021-03-03 ENCOUNTER — Telehealth: Payer: Self-pay | Admitting: *Deleted

## 2021-03-03 NOTE — Telephone Encounter (Signed)
-----   Message from Stuart Tafeen, MD sent at 03/03/2021  5:39 AM EDT ----- Schedule surgery with Dr. T 

## 2021-03-03 NOTE — Telephone Encounter (Signed)
Path to patients wife. Made his surgery appointment with [facility] to have spot treated.

## 2021-03-03 NOTE — Telephone Encounter (Signed)
-----   Message from Lavonna Monarch, MD sent at 03/03/2021  5:39 AM EDT ----- Schedule surgery with Dr. Darene Lamer

## 2021-03-03 NOTE — Telephone Encounter (Signed)
Left message to return our phone call. 

## 2021-03-09 ENCOUNTER — Encounter: Payer: Self-pay | Admitting: Physician Assistant

## 2021-03-09 NOTE — Progress Notes (Signed)
   Follow-Up Visit   Subjective  Nicholas Hamilton is a 75 y.o. male who presents for the following: Annual Exam (New lesion right post auricular x 1 year- no itch or bleed, right forehead & left temple x months- dry spot. Personal history of multiple non mole skin cancer. No family history of melanoma or non mole skin cancer.).   The following portions of the chart were reviewed this encounter and updated as appropriate:  Tobacco  Allergies  Meds  Problems  Med Hx  Surg Hx  Fam Hx      Objective  Well appearing patient in no apparent distress; mood and affect are within normal limits.  All skin waist up examined.  Right Postauricular Area      Assessment & Plan  Neoplasm of uncertain behavior of skin Right Postauricular Area  Skin / nail biopsy Type of biopsy: tangential   Informed consent: discussed and consent obtained   Timeout: patient name, date of birth, surgical site, and procedure verified   Procedure prep:  Patient was prepped and draped in usual sterile fashion (Non sterile) Prep type:  Chlorhexidine Anesthesia: the lesion was anesthetized in a standard fashion   Anesthetic:  1% lidocaine w/ epinephrine 1-100,000 local infiltration Instrument used: flexible razor blade   Outcome: patient tolerated procedure well   Post-procedure details: wound care instructions given    Specimen 1 - Surgical pathology Differential Diagnosis: bcc vs scc  Check Margins: No  AK (actinic keratosis) (7) Right Shoulder - Posterior; Neck - Posterior; Right Forehead (3); Left Temporal Scalp (2)  Destruction of lesion - Left Temporal Scalp, Neck - Posterior, Right Forehead, Right Shoulder - Posterior Complexity: simple   Destruction method: cryotherapy   Informed consent: discussed and consent obtained   Timeout:  patient name, date of birth, surgical site, and procedure verified Lesion destroyed using liquid nitrogen: Yes   Cryotherapy cycles:  3 Outcome: patient  tolerated procedure well with no complications      I, Ilham Roughton, PA-C, have reviewed all documentation's for this visit.  The documentation on 03/09/21 for the exam, diagnosis, procedures and orders are all accurate and complete.

## 2021-06-10 ENCOUNTER — Other Ambulatory Visit: Payer: Self-pay

## 2021-06-10 ENCOUNTER — Ambulatory Visit (INDEPENDENT_AMBULATORY_CARE_PROVIDER_SITE_OTHER): Payer: Medicare Other | Admitting: Physician Assistant

## 2021-06-10 ENCOUNTER — Encounter: Payer: Self-pay | Admitting: Physician Assistant

## 2021-06-10 DIAGNOSIS — C4441 Basal cell carcinoma of skin of scalp and neck: Secondary | ICD-10-CM

## 2021-06-10 NOTE — Patient Instructions (Signed)

## 2021-06-22 ENCOUNTER — Encounter: Payer: Self-pay | Admitting: Physician Assistant

## 2021-06-22 NOTE — Progress Notes (Signed)
   Follow-Up Visit   Subjective  Nicholas Hamilton is a 75 y.o. male who presents for the following: Procedure (Bcc right postauricular  ).   The following portions of the chart were reviewed this encounter and updated as appropriate:  Tobacco  Allergies  Meds  Problems  Med Hx  Surg Hx  Fam Hx      Objective  Well appearing patient in no apparent distress; mood and affect are within normal limits.  A full examination was performed including scalp, head, eyes, ears, nose, lips, neck, chest, axillae, abdomen, back, buttocks, bilateral upper extremities, bilateral lower extremities, hands, feet, fingers, toes, fingernails, and toenails. All findings within normal limits unless otherwise noted below.  Right Postauricular Area Pink  macule   Assessment & Plan  Basal cell carcinoma (BCC) of scalp Right Postauricular Area  Destruction of lesion Complexity: simple   Destruction method: electrodesiccation and curettage   Informed consent: discussed and consent obtained   Timeout:  patient name, date of birth, surgical site, and procedure verified Anesthesia: the lesion was anesthetized in a standard fashion   Anesthetic:  1% lidocaine w/ epinephrine 1-100,000 local infiltration Curettage performed in three different directions: Yes   Curettage cycles:  3 Final wound size (cm):  1.5 Hemostasis achieved with:  ferric subsulfate Outcome: patient tolerated procedure well with no complications   Additional details:  Wound innoculated with 5 fluorouracil solution.    I, Raynah Gomes, PA-C, have reviewed all documentation's for this visit.  The documentation on 06/22/21 for the exam, diagnosis, procedures and orders are all accurate and complete.

## 2021-07-26 ENCOUNTER — Telehealth: Payer: Self-pay | Admitting: Physician Assistant

## 2021-07-26 NOTE — Telephone Encounter (Signed)
Kemoni's wife dropped off cancer policy you need to fill out in part. It's on your desk. It needs to be mailed to them.

## 2021-07-27 NOTE — Telephone Encounter (Signed)
Pathology report and Hcfa forms for 02/23/21 and 06/10/21 dates of service, will be mailed to the patient along with the cancer policy form today.

## 2021-08-19 ENCOUNTER — Ambulatory Visit: Payer: Medicare Other | Admitting: Physician Assistant

## 2021-08-19 ENCOUNTER — Other Ambulatory Visit: Payer: Self-pay

## 2021-08-19 DIAGNOSIS — Z85828 Personal history of other malignant neoplasm of skin: Secondary | ICD-10-CM | POA: Diagnosis not present

## 2021-08-19 DIAGNOSIS — L570 Actinic keratosis: Secondary | ICD-10-CM

## 2021-08-24 ENCOUNTER — Encounter: Payer: Self-pay | Admitting: Physician Assistant

## 2021-08-24 NOTE — Progress Notes (Signed)
   Follow-Up Visit   Subjective  Nicholas Hamilton is a 75 y.o. male who presents for the following: Follow-up (On BCC right postauricular. Patient says its doing fine. No other concerns. ).   The following portions of the chart were reviewed this encounter and updated as appropriate:  Tobacco  Allergies  Meds  Problems  Med Hx  Surg Hx  Fam Hx      Objective  Well appearing patient in no apparent distress; mood and affect are within normal limits.  A focused examination was performed including face and neck. Relevant physical exam findings are noted in the Assessment and Plan.  Right Nasal Sidewall Erythematous patches with gritty scale.   Assessment & Plan  AK (actinic keratosis) Right Nasal Sidewall  Destruction of lesion - Right Nasal Sidewall Complexity: simple   Destruction method: cryotherapy   Informed consent: discussed and consent obtained   Timeout:  patient name, date of birth, surgical site, and procedure verified Lesion destroyed using liquid nitrogen: Yes   Cryotherapy cycles:  3 Outcome: patient tolerated procedure well with no complications   Post-procedure details: wound care instructions given    Post surgical evaluation was clear of recurring BCC.   I, Lamia Mariner, PA-C, have reviewed all documentation's for this visit.  The documentation on 08/24/21 for the exam, diagnosis, procedures and orders are all accurate and complete.

## 2021-08-26 ENCOUNTER — Ambulatory Visit: Payer: Medicare Other | Admitting: Physician Assistant

## 2021-11-10 ENCOUNTER — Encounter: Payer: Self-pay | Admitting: Cardiology

## 2021-11-10 ENCOUNTER — Ambulatory Visit: Payer: Medicare Other | Admitting: Cardiology

## 2021-11-10 ENCOUNTER — Other Ambulatory Visit: Payer: Self-pay

## 2021-11-10 VITALS — BP 128/54 | HR 51 | Ht 69.0 in | Wt 197.0 lb

## 2021-11-10 DIAGNOSIS — R001 Bradycardia, unspecified: Secondary | ICD-10-CM | POA: Diagnosis not present

## 2021-11-10 DIAGNOSIS — E785 Hyperlipidemia, unspecified: Secondary | ICD-10-CM

## 2021-11-10 DIAGNOSIS — E1169 Type 2 diabetes mellitus with other specified complication: Secondary | ICD-10-CM | POA: Diagnosis not present

## 2021-11-10 DIAGNOSIS — Z9861 Coronary angioplasty status: Secondary | ICD-10-CM

## 2021-11-10 DIAGNOSIS — M7989 Other specified soft tissue disorders: Secondary | ICD-10-CM

## 2021-11-10 DIAGNOSIS — I2510 Atherosclerotic heart disease of native coronary artery without angina pectoris: Secondary | ICD-10-CM

## 2021-11-10 DIAGNOSIS — M79661 Pain in right lower leg: Secondary | ICD-10-CM

## 2021-11-10 DIAGNOSIS — I10 Essential (primary) hypertension: Secondary | ICD-10-CM | POA: Diagnosis not present

## 2021-11-10 DIAGNOSIS — E669 Obesity, unspecified: Secondary | ICD-10-CM

## 2021-11-10 NOTE — Patient Instructions (Addendum)
Medication Instructions:  No changes  *If you need a refill on your cardiac medications before your next appointment, please call your pharmacy*   Lab Work:  Not needed  Testing/Procedures:  Will be schedule  at [address]  Your physician has requested that you have a right  lower extremity venous duplex for DVT and Reflux . This test is an ultrasound of the veins in the legs. It looks at venous blood flow that carries blood from the heart to the legs or arms. Allow one hour for a Lower Venous exam. Allow thirty minutes for an Upper Venous exam. There are no restrictions or special instructions.     Follow-Up: At [facility], you and your health needs are our priority.  As part of our continuing mission to provide you with exceptional heart care, we have created designated Provider Care Teams.  These Care Teams include your primary Cardiologist (physician) and Advanced Practice Providers (APPs -  Physician Assistants and Nurse Practitioners) who all work together to provide you with the care you need, when you need it.  We recommend signing up for the patient portal called "MyChart".  Sign up information is provided on this After Visit Summary.  MyChart is used to connect with patients for Virtual Visits (Telemedicine).  Patients are able to view lab/test results, encounter notes, upcoming appointments, etc.  Non-urgent messages can be sent to your provider as well.   To learn more about what you can do with MyChart, go to [email or web address].    Your next appointment:   12 month(s)  The format for your next appointment:   In Person  Provider:   Glenetta Hew, MD

## 2021-11-10 NOTE — Assessment & Plan Note (Signed)
Was told by Urgent Care that he may have had a clot in the leg -- no real change - stayed on ASA.  Told to use ICE   Plan: check LE Venous dopplers - to exclude DVT & Peripheral Thrombus / reflux.

## 2021-11-10 NOTE — Progress Notes (Signed)
Primary Care Provider: Donnajean Lopes, MD Cardiologist: Glenetta Hew, MD Electrophysiologist: None  Clinic Note: Chief Complaint  Patient presents with   Follow-up    Annual exam.  Doing well.   Coronary Artery Disease    No angina or heart failure.   Leg Swelling    He was told by EMS he may have a clot   ===================================  ASSESSMENT/PLAN   Problem List Items Addressed This Visit       Cardiology Problems   CAD S/P PCI to RCA - 2001; Cx 2003 - Primary (Chronic)    Distant history of two-vessel PCI-both RCA and LCx.  Last stress test evaluation 2006 which was false positive.  As such, would not reevaluate unless symptoms warrant.   No active anginal symptoms.  Plan:  Continue current dose of carvedilol, unable to titrate higher because of resting heart rate 51 Off on amlodipine for additional antianginal benefit along with lisinopril gradually reduction. On statin plus fibrate and fish oil On Jardiance plus metformin On aspirin without Thienopyridine. => Okay to hold aspirin 5 days preop for surgeries or procedures.      Relevant Orders   EKG 12-Lead (Completed)   Resistant hypertension (Chronic)    Blood pressure now pretty well controlled on 4 medications: Low-dose amlodipine 2.5 mg daily, carvedilol 12.5 mg twice daily, HCTZ 25 mg daily and lisinopril 20 mg daily.      Relevant Orders   EKG 12-Lead (Completed)   Hyperlipidemia associated with type 2 diabetes mellitus ([place]) (Chronic)    Labs as of December 2021 were well controlled with LDL of 57.  She will be due for follow-up labs now. He has been maintained on 200 mg fenofibrate and 20 mg rosuvastatin.  For now continue current meds until labs rechecked.  Should be getting checked by PCP soon.  On Amaryl and Jardiance along with metformin for diabetes.  Last A1c was 6.3.  Should be due for follow-up check with PCP soon.  For now continue current meds.        Other   Pain and  swelling of right lower leg    Was told by Urgent Care that he may have had a clot in the leg -- no real change - stayed on ASA.  Told to use ICE   Plan: check LE Venous dopplers - to exclude DVT & Peripheral Thrombus / reflux.       Relevant Orders   VAS [place] LOWER EXTREMITY VENOUS REFLUX (Completed)   VAS [place] LOWER EXTREMITY VENOUS (DVT)   Obesity (BMI 30-39.9) (Chronic)    Very gradual weight loss 2 pounds every year.  We talked about trying to increase his level of exercise and dietary adjustment.      Sinus bradycardia by electrocardiogram (Chronic)    Stable.  No symptoms of chronotropic incompetence.  Continue current dose of carvedilol.  If heart rates to drift down, will probably need to back down on beta-blocker and increase amlodipine.      Relevant Orders   EKG 12-Lead (Completed)    ===================================  HPI:    Nicholas Hamilton is a 76 y.o. male with a cardiovascular problem as noted above and below who presents today for annual follow-up.   CAD S/P PCI to RCA - 2001; Cx 2003 (Chronic) -> former patient of Dr. Chase Picket    Essential hypertension (Chronic)    Hyperlipidemia with target LDL less than 70 - Primary (Chronic)    Obesity (BMI  30-39.9) (Chronic)   [name] was last seen on November 04, 2020 -> no major cardiac complaints.  Still exercising 2 to 3 days a week gradually building up to his previous baseline.  Right knee bothering him but doing better.  Just not from well in cold weather.  No angina or heart failure symptoms.  No edema.  No palpitations.  Mild positional dizziness.  Recent Hospitalizations: None  Reviewed  CV studies:    The following studies were reviewed today: (if available, images/films reviewed: From Epic Chart or Care Everywhere) None:   Interval History:   Nicholas Hamilton returns today overall doing berry standpoint.  Ever since recovering from his [place] he has been limited a lot by right-sided knee  pain.  Little swelling on the right side with a little bit of discoloration.  Was told that he may have a clot.  He has had some right-sided leg pain that is worse with sitting but also occurs with walking. .  No significant swelling. He is now finally starting back going to the gym and exercising with his son doing workouts at the gym 2 to 3 days a week again.  His main focus now is that his Isidor Holts has been more and he is wanting to be a great candidate.  CV Review of Symptoms (Summary) Cardiovascular ROS: no chest pain or dyspnea on exertion positive for - edema and mild exercise intolerance, getting better.  More limited by joint pains.  Occasional palpitations negative for - orthopnea, paroxysmal nocturnal dyspnea, rapid heart rate, shortness of breath, or lightheadedness or dizziness, syncope/near syncope or TIA/amaurosis fugax, claudication REVIEWED OF SYSTEMS   Review of Systems  Constitutional:  Negative for malaise/fatigue (Energy getting better) and weight loss.  HENT:  Negative for congestion and nosebleeds.   Respiratory:  Positive for shortness of breath (If he overdoes it with exertion.). Negative for cough.   Cardiovascular:  Positive for leg swelling (Trivial, but notable on the right side).       Per HPI  Gastrointestinal:  Positive for nausea. Negative for blood in stool, melena and vomiting.  Genitourinary:  Negative for hematuria.  Musculoskeletal:  Positive for back pain (Not as limiting) and joint pain (Right greater than left knee.  Also hip). Negative for falls (Not recently) and myalgias.  Neurological:  Positive for tingling (Peripheral neuropathy). Negative for dizziness, focal weakness and weakness.  Psychiatric/Behavioral:  Negative for depression and memory loss. The patient is not nervous/anxious.    I have reviewed and (if needed) personally updated the patient's problem list, medications, allergies, past medical and surgical history, social and family  history.   PAST MEDICAL HISTORY   Past Medical History:  Diagnosis Date   CAD S/P percutaneous coronary angioplasty 2001   PCI to RCA - 2001 (Tetra BMS 3.0 mm x 23 mm)  - BMS; Cx 2003 - Cypher DES 2.5 mm x 28 mm; CAATH 2006 - 80% stenosis in prox RPL with L-R collaterals, too small for PCI - Med Rx   Diabetes mellitus type 2 with complications (HCC)    CAD   GERD (gastroesophageal reflux disease)    History of stress test - Exercise Cardiolite 01/2005   mildly abnormal; 7-METS, peak HR 136 = 84% of max predicted; symptoms:significant dyspnea with exercise induced PVCs-ventricular bigeminy. nondiagnostic inferolateral ST-T changes.EF 47% = Non-obstructive CAD with the exception of 80% RPL stenosis on cath   Hyperlipidemia LDL goal <70    Hypertension, essential  Hyperthyroidism    Stroke [facility])     PAST SURGICAL HISTORY   Past Surgical History:  Procedure Laterality Date   CARDIAC CATHETERIZATION  02/10/2005   EF 50% Mild left ventricular dysfuntion with a stent ejection fraction in posterior basilar segment hypokinesis, widely patent stents in the circumflex, High-grade stenosis is a very small posterior lateral branch with a large left to right collateral bed in the same area.   CORONARY ANGIOPLASTY WITH STENT PLACEMENT  04/29/2002   STENTS: A 2.5 x 28 cypher stents was placed in such a manner that both the proximal and distal portion of the obstruction in the circumflex vessel were well covered.The initial inflation was13 atmospheres for 60 seconds with final inflation being 15 atmospheres for 60 second. each inflation, the patient had burning in the back of his throat and chest pressure; it resolved with deflating the ballon   CORONARY ANGIOPLASTY WITH STENT PLACEMENT  06/30/2000   STENTS: A tetra stent 3.0 x 23, the stent was placed in such a manner that the dissection was well covered and deployed at 9 atmospheres for 59 seconds with the second inflation being 10 atmospheres for 57  seconds. the stent appeared to be slightly hyperexpanded ompared to the remainder of the vessel and there was a mild stent down at the distal portion of the stent.   KNEE SURGERY Left 1983    There is no immunization history on file for this patient.  MEDICATIONS/ALLERGIES   Current Meds  Medication Sig   acetaminophen (TYLENOL) 500 MG tablet Take 500 mg by mouth 2 (two) times daily.   amLODipine (NORVASC) 2.5 MG tablet Take 1 tablet by mouth daily.   aspirin EC 81 MG tablet Take 81 mg by mouth at bedtime.   carvedilol (COREG) 12.5 MG tablet Take 1 tablet (12.5 mg total) by mouth 2 (two) times daily with a meal.   Cyanocobalamin (B-12) 1000 MCG SUBL take 1 tablet under the tongue once daily   Cyanocobalamin (VITAMIN B12 PO) Take by mouth daily.   Dapagliflozin Propanediol (FARXIGA PO) Farxiga   diclofenac Sodium (VOLTAREN) 1 % GEL 1 application   fenofibrate micronized (LOFIBRA) 200 MG capsule Take 200 mg by mouth daily after breakfast.    fish oil-omega-3 fatty acids 1000 MG capsule Take 1 g by mouth 2 (two) times daily.    fluticasone (FLONASE) 50 MCG/ACT nasal spray Place 1 spray into both nostrils daily as needed (seasonal allergies).    gabapentin (NEURONTIN) 300 MG capsule Take 300 mg by mouth daily.   GABAPENTIN PO Take 2 capsules by mouth daily. Pt takes 1 daily   glimepiride (AMARYL) 2 MG tablet Take 2 mg by mouth daily before breakfast.   hydrochlorothiazide (HYDRODIURIL) 25 MG tablet Take 12.5 mg by mouth daily.   JARDIANCE 25 MG TABS tablet    levothyroxine (SYNTHROID) 150 MCG tablet TAKE 1 TABLET BY MOUTH DAILY MONDAY THROUGH SATURDAY   levothyroxine (SYNTHROID, LEVOTHROID) 175 MCG tablet Take 175 mcg by mouth See admin instructions. Take 1 tablet (175 mcg) by mouth Monday thru Saturday (skip Sundays)   Liniments (SALONPAS) PADS Apply 1 each topically daily as needed (pain).   lisinopril (PRINIVIL,ZESTRIL) 40 MG tablet Take 40 mg by mouth daily.   loratadine (CLARITIN) 10  MG tablet Take 1 tablet by mouth daily.   metFORMIN (GLUCOPHAGE) 1000 MG tablet Take 500-1,000 mg by mouth 2 (two) times daily with a meal. Take 1/2 tablet (500 mg) by mouth with breakfast and 1 tablet (1000  mg) with supper   nitroGLYCERIN (NITROSTAT) 0.4 MG SL tablet Place 1 tablet (0.4 mg total) under the tongue every 5 (five) minutes as needed for chest pain.   omeprazole (PRILOSEC) 20 MG capsule Take 1 capsule by mouth daily.   rosuvastatin (CRESTOR) 20 MG tablet TAKE ONE TABLET BY MOUTH DAILY   Vitamin D, Ergocalciferol, (DRISDOL) 1.25 MG (50000 UT) CAPS capsule     Allergies  Allergen Reactions   Penicillins Other (See Comments)    Excessive sweating Has patient had a PCN reaction causing immediate rash, facial/tongue/throat swelling, SOB or lightheadedness with hypotension: No Has patient had a PCN reaction causing severe rash involving mucus membranes or skin necrosis: No Has patient had a PCN reaction that required hospitalization No Has patient had a PCN reaction occurring within the last 10 years: No If all of the above answers are "NO", then may proceed with Cephalosporin use.    SOCIAL HISTORY/FAMILY HISTORY   Reviewed in Epic:  Pertinent findings:  Social History   Tobacco Use   Smoking status: Former    Packs/day: 1.00    Years: 40.00    Pack years: 40.00    Types: Cigarettes    Quit date: 10/24/2000    Years since quitting: 21.1   Smokeless tobacco: Current    Types: Chew  Vaping Use   Vaping Use: Never used  Substance Use Topics   Alcohol use: Yes    Comment: Very seldom   Drug use: No   Social History   Social History Narrative   : Married, 1 child. Chews tobacco, rare alcohol. [facility]. Talks about   wanting to lose weight.  Per Dr. Rex Kras, he needs to continue his exercise and diet and stop snacking between meals.     OBJCTIVE -PE, EKG, labs   Wt Readings from Last 3 Encounters:  11/10/21 197 lb (89.4 kg)  11/04/20 199 lb 12.8 oz  (90.6 kg)  09/05/19 201 lb (91.2 kg)    Physical Exam: BP (!) 128/54    Pulse (!) 51    Ht 5\' 9"  (1.753 m)    Wt 197 lb (89.4 kg)    SpO2 98%    BMI 29.09 kg/m  Physical Exam Vitals reviewed.  Constitutional:      General: He is not in acute distress.    Appearance: Normal appearance. He is obese. He is not ill-appearing (Well-nourished, well-groomed.  Healthy-appearing) or toxic-appearing.  HENT:     Head: Normocephalic and atraumatic.  Neck:     Vascular: No carotid bruit or JVD.  Cardiovascular:     Rate and Rhythm: Regular rhythm. Bradycardia present. No extrasystoles are present.    Chest Wall: PMI is not displaced.     Pulses: Normal pulses.     Heart sounds: S1 normal and S2 normal. Heart sounds are distant. No murmur heard.   No friction rub. No gallop.  Pulmonary:     Effort: Pulmonary effort is normal. No respiratory distress.     Breath sounds: Normal breath sounds. No wheezing, rhonchi or rales.  Chest:     Chest wall: No tenderness.  Musculoskeletal:        General: Normal range of motion.     Cervical back: Normal range of motion and neck supple.     Right lower leg: Edema (1+) present.  Skin:    General: Skin is dry.     Comments:  Mild bilateral spider veins &lower legs/ankles.  -> R leg  mild swelling.  Neurological:     General: No focal deficit present.     Mental Status: He is alert and oriented to person, place, and time.     Gait: Gait normal.  Psychiatric:        Mood and Affect: Mood normal.        Behavior: Behavior normal.        Thought Content: Thought content normal.        Judgment: Judgment normal.    Adult ECG Report  Rate: 51 ;  Rhythm: sinus bradycardia; 1Deg AVB. Otherwise normal axis, intervals & durations.   Narrative Interpretation: stable  Recent Labs:   10/05/2020: TC 137, TG 164, HDL 47, LDL 57.  A1c 6.3.  Hgb 13.3. Cr 1.4, K+ 5.1.  TSH 1.88 No results found for: CHOL, HDL, LDLCALC, LDLDIRECT, TRIG, CHOLHDL Lab Results   Component Value Date   CREATININE 1.64 (H) 09/20/2015   BUN 23 (H) 09/20/2015   NA 136 09/20/2015   K 3.4 (L) 09/20/2015   CL 104 09/20/2015   CO2 24 09/20/2015   CBC Latest Ref Rng & Units 09/20/2015  WBC 4.0 - 10.5 K/uL 7.5  Hemoglobin 13.0 - 17.0 g/dL 13.1  Hematocrit 39.0 - 52.0 % 38.6(L)  Platelets 150 - 400 K/uL 218    No results found for: HGBA1C No results found for: TSH  ==================================================  COVID-19 Education: The signs and symptoms of COVID-19 were discussed with the patient and how to seek care for testing (follow up with PCP or arrange E-visit).    I spent a total of 20 minutes with the patient spent in direct patient consultation.  Additional time spent with chart review  / charting (studies, outside notes, etc): 15 min Total Time: 35 min  Current medicines are reviewed at length with the patient today.  (+/- concerns) none  This visit occurred during the SARS-CoV-2 public health emergency.  Safety protocols were in place, including screening questions prior to the visit, additional usage of staff PPE, and extensive cleaning of exam room while observing appropriate contact time as indicated for disinfecting solutions.  Notice: This dictation was prepared with Dragon dictation along with smart phrase technology. Any transcriptional errors that result from this process are unintentional and may not be corrected upon review.  Studies Ordered:   Orders Placed This Encounter  Procedures   EKG 12-Lead   VAS [place] LOWER EXTREMITY VENOUS REFLUX   VAS [place] LOWER EXTREMITY VENOUS (DVT)    Patient Instructions / Medication Changes & Studies & Tests Ordered   Patient Instructions  Medication Instructions:  No changes  *If you need a refill on your cardiac medications before your next appointment, please call your pharmacy*   Lab Work:  Not needed  Testing/Procedures:  Will be schedule  at [address]  Your  physician has requested that you have a right  lower extremity venous duplex for DVT and Reflux . This test is an ultrasound of the veins in the legs. It looks at venous blood flow that carries blood from the heart to the legs or arms. Allow one hour for a Lower Venous exam. Allow thirty minutes for an Upper Venous exam. There are no restrictions or special instructions.     Follow-Up: At [facility], you and your health needs are our priority.  As part of our continuing mission to provide you with exceptional heart care, we have created designated Provider Care Teams.  These Care Teams include  your primary Cardiologist (physician) and Advanced Practice Providers (APPs -  Physician Assistants and Nurse Practitioners) who all work together to provide you with the care you need, when you need it.  We recommend signing up for the patient portal called "MyChart".  Sign up information is provided on this After Visit Summary.  MyChart is used to connect with patients for Virtual Visits (Telemedicine).  Patients are able to view lab/test results, encounter notes, upcoming appointments, etc.  Non-urgent messages can be sent to your provider as well.   To learn more about what you can do with MyChart, go to [email or web address].    Your next appointment:   12 month(s)  The format for your next appointment:   In Person  Provider:   Glenetta Hew, MD        Addendum: Labs from November 22, 2021 Na+ 139, K+ 4.4, Cl- 105, HCO3-27, BUN 16, Cr 1.5, Glu 144, Ca2+ 10.7; AST 19, ALT 18, AlkP 26   TC 152, TG 185, HDL 39, LDL 76; TSH 1.08. Recommendation was to increase rosuvastatin to 40 mg 3 days a week, reassess labs in 6 months     Glenetta Hew, M.D., M.S. Interventional Cardiologist   Pager # [phone] Phone # [phone] [address] [place], [place] [address]   Thank you for choosing Heartcare at [facility]!!

## 2021-11-12 ENCOUNTER — Other Ambulatory Visit: Payer: Self-pay | Admitting: Cardiology

## 2021-11-26 ENCOUNTER — Ambulatory Visit (HOSPITAL_COMMUNITY)
Admission: RE | Admit: 2021-11-26 | Payer: Medicare Other | Source: Ambulatory Visit | Attending: Cardiology | Admitting: Cardiology

## 2021-12-14 ENCOUNTER — Ambulatory Visit (HOSPITAL_COMMUNITY)
Admission: RE | Admit: 2021-12-14 | Discharge: 2021-12-14 | Disposition: A | Discharge status: Home / Self Care | Payer: Medicare Other | Source: Ambulatory Visit | Attending: Cardiovascular Disease | Admitting: Cardiovascular Disease

## 2021-12-14 ENCOUNTER — Other Ambulatory Visit: Payer: Self-pay

## 2021-12-14 DIAGNOSIS — M79661 Pain in right lower leg: Secondary | ICD-10-CM | POA: Insufficient documentation

## 2021-12-14 DIAGNOSIS — M7989 Other specified soft tissue disorders: Secondary | ICD-10-CM | POA: Diagnosis not present

## 2021-12-16 ENCOUNTER — Telehealth: Payer: Self-pay | Admitting: *Deleted

## 2021-12-16 NOTE — Telephone Encounter (Signed)
The patient's wife  (per dpr-)has been notified of the result and verbalized understanding.  All questions (if any) were answered.  She states patient has orthopedic docotor Dr Hart Robinsons. Request report be faxed to [facility] .  Routed  report. Wife states she will tell patient. Raiford Simmonds, RN 12/16/2021 3:43 PM

## 2021-12-16 NOTE — Telephone Encounter (Signed)
-----   Message from Leonie Man, MD sent at 12/14/2021 10:47 PM EST ----- Lower extremity vein Dopplers -> suggest likely Baker's cyst behind the right knee. . No evidence of deep venous or superficial venous thrombosis/clot.  There is also no evidence of significant vein reflux involving the right leg.  Suspected swelling may be related to Baker's cyst. (Usually managed by Orthopedic Surgery).  Glenetta Hew, MD

## 2021-12-26 ENCOUNTER — Encounter: Payer: Self-pay | Admitting: Cardiology

## 2021-12-26 NOTE — Assessment & Plan Note (Signed)
Labs as of December 2021 were well controlled with LDL of 57.  She will be due for follow-up labs now. ?He has been maintained on 200 mg fenofibrate and 20 mg rosuvastatin.  For now continue current meds until labs rechecked.  Should be getting checked by PCP soon. ? ?On Amaryl and Jardiance along with metformin for diabetes.  Last A1c was 6.3.  Should be due for follow-up check with PCP soon. ? ?For now continue current meds. ?

## 2021-12-26 NOTE — Assessment & Plan Note (Signed)
Very gradual weight loss 2 pounds every year.  We talked about trying to increase his level of exercise and dietary adjustment. ?

## 2021-12-26 NOTE — Assessment & Plan Note (Signed)
Distant history of two-vessel PCI-both RCA and LCx.  Last stress test evaluation 2006 which was false positive.  As such, would not reevaluate unless symptoms warrant.   ?No active anginal symptoms. ? ?Plan:  ?? Continue current dose of carvedilol, unable to titrate higher because of resting heart rate 51 ?? Off on amlodipine for additional antianginal benefit along with lisinopril gradually reduction. ?? On statin plus fibrate and fish oil ?? On Jardiance plus metformin ?? On aspirin without Thienopyridine. => Okay to hold aspirin 5 days preop for surgeries or procedures. ?

## 2021-12-26 NOTE — Progress Notes (Signed)
Labs from November 22, 2021 Na+ 139, K+ 4.4, Cl- 105, HCO3-27, BUN 16, Cr 1.5, Glu 144, Ca2+ 10.7; AST 19, ALT 18, AlkP 26  TC 152, TG 185, HDL 39, LDL 76; TSH 1.08.   Glenetta Hew, MD

## 2021-12-26 NOTE — Assessment & Plan Note (Signed)
Stable.  No symptoms of chronotropic incompetence.  Continue current dose of carvedilol.  If heart rates to drift down, will probably need to back down on beta-blocker and increase amlodipine. ?

## 2021-12-26 NOTE — Assessment & Plan Note (Signed)
Blood pressure now pretty well controlled on 4 medications: ?Low-dose amlodipine 2.5 mg daily, carvedilol 12.5 mg twice daily, HCTZ 25 mg daily and lisinopril 20 mg daily. ?

## 2021-12-28 ENCOUNTER — Telehealth: Payer: Self-pay | Admitting: *Deleted

## 2021-12-28 DIAGNOSIS — E1169 Type 2 diabetes mellitus with other specified complication: Secondary | ICD-10-CM

## 2021-12-28 DIAGNOSIS — E785 Hyperlipidemia, unspecified: Secondary | ICD-10-CM

## 2021-12-28 MED ORDER — ROSUVASTATIN CALCIUM 40 MG PO TABS: ORAL_TABLET | ORAL | 3 refills | Status: DC

## 2021-12-28 NOTE — Telephone Encounter (Signed)
-----   Message from Leonie Man, MD sent at 12/26/2021  6:42 PM EST ----- ?Lipid panel does not look as good as it did before.  LDL level has gone up. ? ?Would like to increase rosuvastatin to 40 mg 3 days out of the week and do 20 mg rest the time.  Should have lipids and LFTs rechecked in about 6 months. ? ?Glenetta Hew, MD ? ?Change simvastatin prescription to allow for additional dosing of 40 mg 3 days a week and 20 mg 4 days a week. ? ? ?

## 2021-12-28 NOTE — Telephone Encounter (Signed)
Spoke to patient.  Patient is aware of the change in statin medication  - Rosuvastatin    ? 40 mg  3 days week - ( M,W,F)  20 mg 4 days week ( Tu,Thu, Sat,Sun) ? Prescriptions sent to pharmacy . Labs mailed to patient for recheck in 6 months ? Patient verbalized understanding. ?

## 2021-12-28 NOTE — Telephone Encounter (Signed)
-----   Message from Leonie Man, MD sent at 12/26/2021  6:39 PM EST ----- ?Labs from November 22, 2021 ?Na+ 139, K+ 4.4, Cl- 105, HCO3-27, BUN 16, Cr 1.5, Glu 144, Ca2+ 10.7; AST 19, ALT 18, AlkP 26 ? ?TC 152, TG 185, HDL 39, LDL 76; TSH 1.08. ? ? ?Glenetta Hew, MD ? ? ? ?

## 2022-04-27 ENCOUNTER — Ambulatory Visit: Payer: Medicare Other | Admitting: Physician Assistant

## 2022-04-27 ENCOUNTER — Encounter: Payer: Self-pay | Admitting: Physician Assistant

## 2022-04-27 DIAGNOSIS — D485 Neoplasm of uncertain behavior of skin: Secondary | ICD-10-CM

## 2022-04-27 DIAGNOSIS — C4492 Squamous cell carcinoma of skin, unspecified: Secondary | ICD-10-CM

## 2022-04-27 DIAGNOSIS — Z1283 Encounter for screening for malignant neoplasm of skin: Secondary | ICD-10-CM

## 2022-04-27 DIAGNOSIS — D044 Carcinoma in situ of skin of scalp and neck: Secondary | ICD-10-CM

## 2022-04-27 DIAGNOSIS — Z85828 Personal history of other malignant neoplasm of skin: Secondary | ICD-10-CM | POA: Diagnosis not present

## 2022-04-27 HISTORY — DX: Squamous cell carcinoma of skin, unspecified: C44.92

## 2022-04-27 NOTE — Progress Notes (Signed)
   Follow-Up Visit   Subjective  Nicholas Hamilton is a 76 y.o. male who presents for the following: Annual Exam (Here for annual skin exam. Concerns has an itchy lesion on back but thinks we looked at it before. Also check hairline area. History of non mole skin cancer. ).   The following portions of the chart were reviewed this encounter and updated as appropriate:  Tobacco  Allergies  Meds  Problems  Med Hx  Surg Hx  Fam Hx      Objective  Well appearing patient in no apparent distress; mood and affect are within normal limits.  All skin waist up examined.  Right Frontal Scalp Hyperkeratotic scale with pink base         Assessment & Plan  Neoplasm of uncertain behavior of skin Right Frontal Scalp  Skin / nail biopsy Type of biopsy: tangential   Informed consent: discussed and consent obtained   Timeout: patient name, date of birth, surgical site, and procedure verified   Procedure prep:  Patient was prepped and draped in usual sterile fashion (Non sterile) Prep type:  Chlorhexidine Anesthesia: the lesion was anesthetized in a standard fashion   Anesthetic:  1% lidocaine w/ epinephrine 1-100,000 local infiltration Instrument used: flexible razor blade   Hemostasis achieved with: aluminum chloride   Outcome: patient tolerated procedure well   Post-procedure details: sterile dressing applied and wound care instructions given   Dressing type: bandage and petrolatum    Destruction of lesion Complexity: simple   Destruction method: electrodesiccation and curettage   Informed consent: discussed and consent obtained   Timeout:  patient name, date of birth, surgical site, and procedure verified Anesthesia: the lesion was anesthetized in a standard fashion   Anesthetic:  1% lidocaine w/ epinephrine 1-100,000 local infiltration Curettage performed in three different directions: Yes   Electrodesiccation performed over the curetted area: Yes   Curettage cycles:   3 Margin per side (cm):  0.1 Final wound size (cm):  1 Hemostasis achieved with:  aluminum chloride Outcome: patient tolerated procedure well with no complications   Post-procedure details: sterile dressing applied and wound care instructions given   Dressing type: bandage and petrolatum    Specimen 1 - Surgical pathology Differential Diagnosis: R/O BCC vs SCC - treated after biopsy  Check Margins: Yes    I, Arrington Bencomo, PA-C, have reviewed all documentation's for this visit.  The documentation on 04/27/22 for the exam, diagnosis, procedures and orders are all accurate and complete.

## 2022-05-02 ENCOUNTER — Telehealth: Payer: Self-pay

## 2022-05-02 NOTE — Telephone Encounter (Signed)
Phone call to patient with his pathology results. Voicemail left for patient to give the office a call back.  ?

## 2022-05-02 NOTE — Telephone Encounter (Signed)
-----   Message from Warren Danes, [place] sent at 04/28/2022  3:48 PM EDT ----- Tx with biopsy.

## 2022-05-03 NOTE — Telephone Encounter (Signed)
Phone call from patient returning our call. Pathology results given to patient.

## 2022-05-03 NOTE — Telephone Encounter (Signed)
-----   Message from Warren Danes, [place] sent at 04/28/2022  3:48 PM EDT ----- Tx with biopsy.

## 2022-05-03 NOTE — Telephone Encounter (Signed)
On

## 2022-06-10 ENCOUNTER — Telehealth (INDEPENDENT_AMBULATORY_CARE_PROVIDER_SITE_OTHER): Payer: Medicare Other | Admitting: Dermatology

## 2022-06-10 ENCOUNTER — Telehealth: Payer: Self-pay | Admitting: Physician Assistant

## 2022-06-10 DIAGNOSIS — D044 Carcinoma in situ of skin of scalp and neck: Secondary | ICD-10-CM

## 2022-06-10 NOTE — Telephone Encounter (Signed)
I returned patient's phone call.  He was told by his excellent internist Dr. 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  I explained to the patient and his wife that after [facility] obtained the biopsy she treated the base with curettage plus cautery and roughly 90% of the time no further procedure will be needed.  If there were to be a recurrence it will always be local and will still be quite curable.  They expressed good understanding.  They also will drop off some papers for [facility] or me to sign so they can be reimbursed by cancer policy.

## 2022-06-10 NOTE — Telephone Encounter (Signed)
Patient left message on office voice mail that he was confused about his pathology results from his last visit with [facility], PA-C.  Patient said he was told that the lesion was a skin cancer and not melanoma, but that it was treated at the time of the biopsy.  Patient stated that when he took his records to another dermatology office that they told him that he needed further treatment.  Patient would like to speak to someone about this.

## 2022-06-13 NOTE — Telephone Encounter (Signed)
Phone call to patient to let him know that we did treat lesion. No further work needed.

## 2022-11-09 ENCOUNTER — Ambulatory Visit: Payer: Medicare Other | Admitting: Cardiology

## 2022-11-09 NOTE — Progress Notes (Deleted)
Primary Care Provider: Donnajean Lopes, MD [place] Cardiologist: Glenetta Hew, MD Electrophysiologist: None  Clinic Note: No chief complaint on file.   ===================================  ASSESSMENT/PLAN   Problem List Items Addressed This Visit       Cardiology Problems   CAD S/P PCI to RCA - 2001; Cx 2003 (Chronic)   Resistant hypertension - Primary (Chronic)   Hyperlipidemia associated with type 2 diabetes mellitus (HCC) (Chronic)     Other   Obesity (BMI 30-39.9) (Chronic)   Sinus bradycardia by electrocardiogram (Chronic)    ===================================  HPI:    DMIR RIEF is a 77 y.o. male with a PMH below who presents today for *** at the request of  Donnajean Lopes, MD.  MEREL KARAMAN was last seen on Nov 10, 2021  LE V dopplers - baker Cyst R Recent Hospitalizations: ***  Reviewed  CV studies:    The following studies were reviewed today: (if available, images/films reviewed: From Epic Chart or Care Everywhere) ***:  Interval History:   [place] Dense Gabrielson   CV Review of Symptoms (Summary): Cardiovascular ROS: {roscv:310661}  REVIEWED OF SYSTEMS   ROS  I have reviewed and (if needed) personally updated the patient's problem list, medications, allergies, past medical and surgical history, social and family history.   PAST MEDICAL HISTORY   Past Medical History:  Diagnosis Date   CAD S/P percutaneous coronary angioplasty 10/25/1999   PCI to RCA - 2001 (Tetra BMS 3.0 mm x 23 mm)  - BMS; Cx 2003 - Cypher DES 2.5 mm x 28 mm; CAATH 2006 - 80% stenosis in prox RPL with L-R collaterals, too small for PCI - Med Rx   Diabetes mellitus type 2 with complications (HCC)    CAD   GERD (gastroesophageal reflux disease)    History of stress test - Exercise Cardiolite 01/22/2005   mildly abnormal; 7-METS, peak HR 136 = 84% of max predicted; symptoms:significant dyspnea with exercise induced PVCs-ventricular bigeminy.  nondiagnostic inferolateral ST-T changes.EF 47% = Non-obstructive CAD with the exception of 80% RPL stenosis on cath   Hyperlipidemia LDL goal <70    Hypertension, essential    Hyperthyroidism    Squamous cell carcinoma of skin 04/27/2022   Right Frontal Scalp (in situ) (tx p bx)   Stroke ([place])     PAST SURGICAL HISTORY   Past Surgical History:  Procedure Laterality Date   CARDIAC CATHETERIZATION  02/10/2005   EF 50% Mild left ventricular dysfuntion with a stent ejection fraction in posterior basilar segment hypokinesis, widely patent stents in the circumflex, High-grade stenosis is a very small posterior lateral branch with a large left to right collateral bed in the same area.   CORONARY ANGIOPLASTY WITH STENT PLACEMENT  04/29/2002   STENTS: A 2.5 x 28 cypher stents was placed in such a manner that both the proximal and distal portion of the obstruction in the circumflex vessel were well covered.The initial inflation was13 atmospheres for 60 seconds with final inflation being 15 atmospheres for 60 second. each inflation, the patient had burning in the back of his throat and chest pressure; it resolved with deflating the ballon   CORONARY ANGIOPLASTY WITH STENT PLACEMENT  06/30/2000   STENTS: A tetra stent 3.0 x 23, the stent was placed in such a manner that the dissection was well covered and deployed at 9 atmospheres for 59 seconds with the second inflation being 10 atmospheres for 57 seconds. the stent appeared to be slightly hyperexpanded ompared  to the remainder of the vessel and there was a mild stent down at the distal portion of the stent.   KNEE SURGERY Left 1983     There is no immunization history on file for this patient.  MEDICATIONS/ALLERGIES   No outpatient medications have been marked as taking for the 11/09/22 encounter (Appointment) with Leonie Man, MD.    Allergies  Allergen Reactions   Penicillins Other (See Comments)    Excessive sweating Has patient had a  PCN reaction causing immediate rash, facial/tongue/throat swelling, SOB or lightheadedness with hypotension: No Has patient had a PCN reaction causing severe rash involving mucus membranes or skin necrosis: No Has patient had a PCN reaction that required hospitalization No Has patient had a PCN reaction occurring within the last 10 years: No If all of the above answers are "NO", then may proceed with Cephalosporin use.    SOCIAL HISTORY/FAMILY HISTORY   Reviewed in Epic:  Pertinent findings:  Social History   Tobacco Use   Smoking status: Former    Packs/day: 1.00    Years: 40.00    Total pack years: 40.00    Types: Cigarettes    Quit date: 10/24/2000    Years since quitting: 22.0   Smokeless tobacco: Current    Types: Chew  Vaping Use   Vaping Use: Never used  Substance Use Topics   Alcohol use: Yes    Comment: Very seldom   Drug use: No   Social History   Social History Narrative   : Married, 1 child. Chews tobacco, rare alcohol. [facility] Talks about   wanting to lose weight.  Per Dr. Rex Kras, he needs to continue his exercise and diet and stop snacking between meals.     OBJCTIVE -PE, EKG, labs   Wt Readings from Last 3 Encounters:  11/10/21 197 lb (89.4 kg)  11/04/20 199 lb 12.8 oz (90.6 kg)  09/05/19 201 lb (91.2 kg)    Physical Exam: There were no vitals taken for this visit. Physical Exam   Adult ECG Report  Rate: *** ;  Rhythm: {rhythm:17366};   Narrative Interpretation: ***  Recent Labs:  ***  No results found for: "CHOL", "HDL", "Bono", "LDLDIRECT", "TRIG", "CHOLHDL" Lab Results  Component Value Date   CREATININE 1.64 (H) 09/20/2015   BUN 23 (H) 09/20/2015   NA 136 09/20/2015   K 3.4 (L) 09/20/2015   CL 104 09/20/2015   CO2 24 09/20/2015      Latest Ref Rng & Units 09/20/2015    2:40 PM  CBC  WBC 4.0 - 10.5 K/uL 7.5   Hemoglobin 13.0 - 17.0 g/dL 13.1   Hematocrit 39.0 - 52.0 % 38.6   Platelets 150 - 400 K/uL 218      No results found for: "HGBA1C" No results found for: "TSH"  ================================================== I spent a total of ***minutes with the patient spent in direct patient consultation.  Additional time spent with chart review  / charting (studies, outside notes, etc): *** min Total Time: *** min  Current medicines are reviewed at length with the patient today.  (+/- concerns) ***  Notice: This dictation was prepared with Dragon dictation along with smart phrase technology. Any transcriptional errors that result from this process are unintentional and may not be corrected upon review.  Studies Ordered:   No orders of the defined types were placed in this encounter.  No orders of the defined types were placed in this encounter.   Patient  Instructions / Medication Changes & Studies & Tests Ordered   There are no Patient Instructions on file for this visit.     Leonie Man, MD, MS Glenetta Hew, M.D., M.S. Interventional Cardiologist  [place]  Pager # [phone] Phone # [phone] [address] [place], [place] [address]   Thank you for choosing [facility] [place]!!

## 2022-11-21 ENCOUNTER — Encounter: Payer: Self-pay | Admitting: Nurse Practitioner

## 2022-11-21 ENCOUNTER — Ambulatory Visit: Payer: Medicare Other | Attending: Cardiology | Admitting: Nurse Practitioner

## 2022-11-21 VITALS — BP 134/72 | HR 57 | Ht 69.0 in | Wt 208.0 lb

## 2022-11-21 DIAGNOSIS — I10 Essential (primary) hypertension: Secondary | ICD-10-CM | POA: Diagnosis not present

## 2022-11-21 DIAGNOSIS — I2510 Atherosclerotic heart disease of native coronary artery without angina pectoris: Secondary | ICD-10-CM

## 2022-11-21 DIAGNOSIS — E785 Hyperlipidemia, unspecified: Secondary | ICD-10-CM

## 2022-11-21 DIAGNOSIS — R001 Bradycardia, unspecified: Secondary | ICD-10-CM | POA: Diagnosis not present

## 2022-11-21 DIAGNOSIS — M7121 Synovial cyst of popliteal space [Baker], right knee: Secondary | ICD-10-CM

## 2022-11-21 DIAGNOSIS — E119 Type 2 diabetes mellitus without complications: Secondary | ICD-10-CM

## 2022-11-21 NOTE — Addendum Note (Signed)
Addended by: Diona Browner C on: 11/21/2022 12:36 PM   Modules accepted: Orders

## 2022-11-21 NOTE — Patient Instructions (Addendum)
Medication Instructions:  Your physician recommends that you continue on your current medications as directed. Please refer to the Current Medication list given to you today.   *If you need a refill on your cardiac medications before your next appointment, please call your pharmacy*   Lab Work: NONE ordered at this time of appointment   If you have labs (blood work) drawn today and your tests are completely normal, you will receive your results only by: MyChart Message (if you have MyChart) OR A paper copy in the mail If you have any lab test that is abnormal or we need to change your treatment, we will call you to review the results.   Testing/Procedures: NONE ordered at this time of appointment     Follow-Up: At [facility], you and your health needs are our priority.  As part of our continuing mission to provide you with exceptional heart care, we have created designated Provider Care Teams.  These Care Teams include your primary Cardiologist (physician) and Advanced Practice Providers (APPs -  Physician Assistants and Nurse Practitioners) who all work together to provide you with the care you need, when you need it.  We recommend signing up for the patient portal called "MyChart".  Sign up information is provided on this After Visit Summary.  MyChart is used to connect with patients for Virtual Visits (Telemedicine).  Patients are able to view lab/test results, encounter notes, upcoming appointments, etc.  Non-urgent messages can be sent to your provider as well.   To learn more about what you can do with MyChart, go to [email or web address].    Your next appointment:   1 year(s)  Provider:   David Harding, MD     Other Instructions   

## 2022-11-21 NOTE — Progress Notes (Signed)
Office Visit    Patient Name: Nicholas Hamilton Date of Encounter: 11/21/2022  Primary Care Provider:  Donnajean Lopes, MD Primary Cardiologist:  Glenetta Hew, MD  Chief Complaint    77 year old male with a history of CAD s/p BMS-RCA in 2001, DES x2-LCx in 2003, sinus bradycardia, hypertension, hyperlipidemia, type 2 diabetes, hypothyroidism, Baker's cyst, and obesity who presents for follow-up related to CAD.  Past Medical History    Past Medical History:  Diagnosis Date   CAD S/P percutaneous coronary angioplasty 10/25/1999   PCI to RCA - 2001 (Tetra BMS 3.0 mm x 23 mm)  - BMS; Cx 2003 - Cypher DES 2.5 mm x 28 mm; CAATH 2006 - 80% stenosis in prox RPL with L-R collaterals, too small for PCI - Med Rx   Diabetes mellitus type 2 with complications (HCC)    CAD   GERD (gastroesophageal reflux disease)    History of stress test - Exercise Cardiolite 01/22/2005   mildly abnormal; 7-METS, peak HR 136 = 84% of max predicted; symptoms:significant dyspnea with exercise induced PVCs-ventricular bigeminy. nondiagnostic inferolateral ST-T changes.EF 47% = Non-obstructive CAD with the exception of 80% RPL stenosis on cath   Hyperlipidemia LDL goal <70    Hypertension, essential    Hyperthyroidism    Squamous cell carcinoma of skin 04/27/2022   Right Frontal Scalp (in situ) (tx p bx)   Stroke [facility])    Past Surgical History:  Procedure Laterality Date   CARDIAC CATHETERIZATION  02/10/2005   EF 50% Mild left ventricular dysfuntion with a stent ejection fraction in posterior basilar segment hypokinesis, widely patent stents in the circumflex, High-grade stenosis is a very small posterior lateral branch with a large left to right collateral bed in the same area.   CORONARY ANGIOPLASTY WITH STENT PLACEMENT  04/29/2002   STENTS: A 2.5 x 28 cypher stents was placed in such a manner that both the proximal and distal portion of the obstruction in the circumflex vessel were well covered.The  initial inflation was13 atmospheres for 60 seconds with final inflation being 15 atmospheres for 60 second. each inflation, the patient had burning in the back of his throat and chest pressure; it resolved with deflating the ballon   CORONARY ANGIOPLASTY WITH STENT PLACEMENT  06/30/2000   STENTS: A tetra stent 3.0 x 23, the stent was placed in such a manner that the dissection was well covered and deployed at 9 atmospheres for 59 seconds with the second inflation being 10 atmospheres for 57 seconds. the stent appeared to be slightly hyperexpanded ompared to the remainder of the vessel and there was a mild stent down at the distal portion of the stent.   KNEE SURGERY Left 1983    Allergies  Allergies  Allergen Reactions   Penicillins Other (See Comments)    Excessive sweating Has patient had a PCN reaction causing immediate rash, facial/tongue/throat swelling, SOB or lightheadedness with hypotension: No Has patient had a PCN reaction causing severe rash involving mucus membranes or skin necrosis: No Has patient had a PCN reaction that required hospitalization No Has patient had a PCN reaction occurring within the last 10 years: No If all of the above answers are "NO", then may proceed with Cephalosporin use.     Labs/Other Studies Reviewed    Recent Labs: No results found for requested labs within last 365 days.  Recent Lipid Panel No results found for: "CHOL", "TRIG", "HDL", "CHOLHDL", "VLDL", "LDLCALC", "LDLDIRECT"  History of Present Illness  77 year old male with the above past medical history including CAD s/p BMS-RCA in 2001, DES x2-LCx in 2003, sinus bradycardia, hypertension, hyperlipidemia, type 2 diabetes, hypothyroidism, Baker's cyst, and obesity.  Myoview in 2006 was false positive. Follow-up cath in 01/2005 showed widely patent stents in the circumflex, high-grade stenosis in a very small posterior lateral branch with large left-to-right collateral bed in the same area, EF  50%, mild LV dysfunction. He was last seen in the office on 11/10/2021 and was stable from a cardiac standpoint. He denied symptoms concerning for angina.  He did note right lower extremity pain and swelling.  Lower extremity Dopplers showed no evidence of DVT, possible Baker's cyst.   He presents today for follow-up. Since his last visit he has done well from a cardiac standpoint.  He continues to have intermittent swelling in his right lower extremity, this initially improved with drainage of Baker's cyst.  He denies symptoms concerning for angina.  He continues to exercise regularly at the gym.  Overall, he reports feeling well.  Home Medications    Current Outpatient Medications  Medication Sig Dispense Refill   acetaminophen (TYLENOL) 500 MG tablet Take 500 mg by mouth 2 (two) times daily.     amLODipine (NORVASC) 2.5 MG tablet Take 1 tablet by mouth daily.     aspirin EC 81 MG tablet Take 81 mg by mouth at bedtime.     carvedilol (COREG) 12.5 MG tablet Take 1 tablet (12.5 mg total) by mouth 2 (two) times daily with a meal. 60 tablet 11   Cyanocobalamin (VITAMIN B12 PO) Take by mouth daily.     diclofenac Sodium (VOLTAREN) 1 % GEL 1 application     fenofibrate micronized (LOFIBRA) 200 MG capsule Take 200 mg by mouth daily after breakfast.      fish oil-omega-3 fatty acids 1000 MG capsule Take 1 g by mouth 2 (two) times daily.      fluticasone (FLONASE) 50 MCG/ACT nasal spray Place 1 spray into both nostrils daily as needed (seasonal allergies).      gabapentin (NEURONTIN) 300 MG capsule Take 300 mg by mouth daily.     glimepiride (AMARYL) 2 MG tablet Take 2 mg by mouth daily before breakfast.     hydrochlorothiazide (HYDRODIURIL) 25 MG tablet Take 12.5 mg by mouth daily.     levothyroxine (SYNTHROID) 150 MCG tablet TAKE 1 TABLET BY MOUTH DAILY MONDAY THROUGH SATURDAY     levothyroxine (SYNTHROID, LEVOTHROID) 175 MCG tablet Take 175 mcg by mouth See admin instructions. Take 1 tablet (175 mcg)  by mouth Monday thru Saturday (skip Sundays)     Liniments (SALONPAS) PADS Apply 1 each topically daily as needed (pain).     lisinopril (PRINIVIL,ZESTRIL) 40 MG tablet Take 40 mg by mouth daily.     loratadine (CLARITIN) 10 MG tablet Take 1 tablet by mouth daily.     metFORMIN (GLUCOPHAGE) 1000 MG tablet Take 500-1,000 mg by mouth 2 (two) times daily with a meal. Take 1/2 tablet (500 mg) by mouth with breakfast and 1 tablet (1000 mg) with supper     nitroGLYCERIN (NITROSTAT) 0.4 MG SL tablet Place 1 tablet (0.4 mg total) under the tongue every 5 (five) minutes as needed for chest pain. 25 tablet 1   omeprazole (PRILOSEC) 20 MG capsule Take 1 capsule by mouth daily.     rosuvastatin (CRESTOR) 40 MG tablet Take 40 mg  tablet by mouth Monday , Wednesday ,Friday and 20 mg  (1/2 tablet) all the other  days as directed. 90 tablet 3   Vitamin D, Ergocalciferol, (DRISDOL) 1.25 MG (50000 UT) CAPS capsule      Dapagliflozin Propanediol (FARXIGA PO) Farxiga     JARDIANCE 25 MG TABS tablet      No current facility-administered medications for this visit.     Review of Systems    He denies chest pain, palpitations, dyspnea, pnd, orthopnea, n, v, dizziness, syncope, edema, weight gain, or early satiety. All other systems reviewed and are otherwise negative except as noted above.   Physical Exam    VS:  BP 134/72   Pulse (!) 57   Ht '5\' 9"'$  (1.753 m)   Wt 208 lb (94.3 kg)   SpO2 98%   BMI 30.72 kg/m   GEN: Well nourished, well developed, in no acute distress. HEENT: normal. Neck: Supple, no JVD, carotid bruits, or masses. Cardiac: RRR, no murmurs, rubs, or gallops. No clubbing, cyanosis, edema.  Radials/DP/PT 2+ and equal bilaterally.  Respiratory:  Respirations regular and unlabored, clear to auscultation bilaterally. GI: Soft, nontender, nondistended, BS + x 4. MS: no deformity or atrophy. Skin: warm and dry, no rash. Neuro:  Strength and sensation are intact. Psych: Normal affect.  Accessory  Clinical Findings    ECG personally reviewed by me today -sinus bradycardia 57 bpm, first-degree AV block- no acute changes.   Lab Results  Component Value Date   WBC 7.5 09/20/2015   HGB 13.1 09/20/2015   HCT 38.6 (L) 09/20/2015   MCV 87.9 09/20/2015   PLT 218 09/20/2015   Lab Results  Component Value Date   CREATININE 1.64 (H) 09/20/2015   BUN 23 (H) 09/20/2015   NA 136 09/20/2015   K 3.4 (L) 09/20/2015   CL 104 09/20/2015   CO2 24 09/20/2015   Lab Results  Component Value Date   ALT 16 (L) 09/20/2015   AST 19 09/20/2015   ALKPHOS 26 (L) 09/20/2015   BILITOT 0.6 09/20/2015   No results found for: "CHOL", "HDL", "LDLCALC", "LDLDIRECT", "TRIG", "CHOLHDL"  No results found for: "HGBA1C"  Assessment & Plan    1. CAD:  S/p BMS-RCA in 2001, DES x2-LCx in 2003. Stable with no anginal symptoms. No indication for ischemic evaluation.  Continue aspirin, carvedilol, amlodipine, hydrochlorothiazide, lisinopril, and Crestor.  2. Sinus bradycardia: Noted on prior EKGs. Stable on EKG today.  Asymptomatic.   3. Hypertension: BP well controlled. Continue current antihypertensive regimen.   4.Hyperlipidemia: LDL was 61 2023.  Continue aspirin, Crestor (he is on a modified dosing schedule).  5. Type 2 diabetes: A1c was 6.7 in 09/2022.  6. Baker's cyst: He continues to note right lower extremity swelling.  This initially improved with drainage of Baker's cyst but has since returned.  Prior venous duplex was negative for DVT, no significant venous reflux.  DP 2+ and equal bilaterally.  Follow-up with orthopedics.  7. Disposition: Follow-up in 1 year.       Lenna Sciara, NP 11/21/2022, 12:32 PM

## 2022-11-25 NOTE — Addendum Note (Signed)
Addended by: Jacqulynn Cadet on: 11/25/2022 09:35 AM   Modules accepted: Orders

## 2022-12-06 ENCOUNTER — Telehealth: Payer: Self-pay | Admitting: Cardiology

## 2022-12-06 NOTE — Telephone Encounter (Signed)
Pt c/o swelling: STAT is pt has developed SOB within 24 hours  If swelling, where is the swelling located? Feet  How much weight have you gained and in what time span? No  Have you gained 3 pounds in a day or 5 pounds in a week? No  Do you have a log of your daily weights (if so, list)? 197 to 200 lbs  Are you currently taking a fluid pill? Yes  Are you currently SOB? No  Have you traveled recently? No  Pt states that he had swelling in the feet yesterday but seems to have gone down today. Wanted to make provider aware and callback from nurse

## 2022-12-06 NOTE — Telephone Encounter (Signed)
Attempted to contact patient, phone did not answer, no voicemail to leave message.   Will try again later.

## 2022-12-14 NOTE — Telephone Encounter (Signed)
Attempted to contact patient to see if any other assistance was needed.  LVM to call back if still needed.  Left call back number.

## 2022-12-19 NOTE — Telephone Encounter (Signed)
Spoke with pt, he reports the swelling has gone away and his weight is stable.

## 2023-01-11 ENCOUNTER — Other Ambulatory Visit: Payer: Self-pay | Admitting: Cardiology

## 2023-01-12 ENCOUNTER — Telehealth: Payer: Self-pay | Admitting: Diagnostic Neuroimaging

## 2023-01-12 NOTE — Telephone Encounter (Signed)
Received sleep referral from [facility] Dr./ Leanna Battles placed in sleep referrals box

## 2023-01-28 ENCOUNTER — Other Ambulatory Visit (HOSPITAL_BASED_OUTPATIENT_CLINIC_OR_DEPARTMENT_OTHER): Payer: Self-pay

## 2023-01-28 DIAGNOSIS — R0683 Snoring: Secondary | ICD-10-CM

## 2023-02-07 ENCOUNTER — Ambulatory Visit (HOSPITAL_BASED_OUTPATIENT_CLINIC_OR_DEPARTMENT_OTHER): Payer: Medicare Other | Attending: Internal Medicine | Admitting: Internal Medicine

## 2023-02-07 DIAGNOSIS — R0602 Shortness of breath: Secondary | ICD-10-CM | POA: Insufficient documentation

## 2023-02-07 DIAGNOSIS — G4733 Obstructive sleep apnea (adult) (pediatric): Secondary | ICD-10-CM | POA: Insufficient documentation

## 2023-02-07 DIAGNOSIS — R0683 Snoring: Secondary | ICD-10-CM | POA: Diagnosis present

## 2023-02-11 DIAGNOSIS — R0683 Snoring: Secondary | ICD-10-CM | POA: Diagnosis not present

## 2023-02-11 NOTE — Procedures (Signed)
    Patient Name: Nicholas Hamilton, Nicholas Hamilton Date: 02/07/2023 Gender: Male D.O.B: 02-Jun-1946 Age (years): 77 Referring Provider: Jarome Matin Height (inches): 68 Interpreting Physician: Jetty Duhamel MD, ABSM Weight (lbs): 204 RPSGT: [place] Sink BMI: 31 MRN: [id number] Neck Size: 17.00  CLINICAL INFORMATION Sleep Study Type: HST Indication for sleep study: Snoring Epworth Sleepiness Score: 2  SLEEP STUDY TECHNIQUE A multi-channel overnight portable sleep study was performed. The channels recorded were: nasal airflow, thoracic respiratory movement, and oxygen saturation with a pulse oximetry. Snoring was also monitored.  MEDICATIONS Patient self administered medications include: none reported.  SLEEP ARCHITECTURE Patient was studied for 365.5 minutes. The sleep efficiency was 100.0 % and the patient was supine for 0%. The arousal index was 0.0 per hour.  RESPIRATORY PARAMETERS The overall AHI was 56.1 per hour, with a central apnea index of 0 per hour. The oxygen nadir was 82% during sleep.  CARDIAC DATA Mean heart rate during sleep was 56.3 bpm.  IMPRESSIONS - Severe obstructive sleep apnea occurred during this study (AHI = 56.1/h). - Moderate oxygen desaturation was noted during this study (Min O2 = 82%, Mean 96%). - Patient snored.  DIAGNOSIS - Obstructive Sleep Apnea (G47.33)  RECOMMENDATIONS - Suggest CPAP titration sleep study or autopap. Other options would be based on clinical judgment. - Be careful with alcohol, sedatives and other CNS depressants that may worsen sleep apnea and disrupt normal sleep architecture. - Sleep hygiene should be reviewed to assess factors that may improve sleep quality. - Weight management and regular exercise should be initiated or continued.  [Electronically signed] 02/11/2023 12:02 PM  Jetty Duhamel MD, ABSM Diplomate, American Board of Sleep Medicine NPI: [id number]                          Jetty Duhamel Diplomate, American Board of Sleep Medicine  ELECTRONICALLY SIGNED ON:  02/11/2023, 11:55 AM [facility] PH: [phone]   FX: [phone] ACCREDITED BY THE AMERICAN ACADEMY OF SLEEP MEDICINE

## 2023-03-08 ENCOUNTER — Other Ambulatory Visit: Payer: Self-pay | Admitting: Cardiology

## 2023-11-28 ENCOUNTER — Ambulatory Visit: Payer: Medicare Other | Admitting: Cardiology

## 2024-01-29 ENCOUNTER — Ambulatory Visit: Payer: Medicare Other | Attending: Cardiology | Admitting: Cardiology

## 2024-01-29 ENCOUNTER — Encounter: Payer: Self-pay | Admitting: Cardiology

## 2024-01-29 VITALS — BP 128/74 | HR 61 | Ht 69.0 in | Wt 198.6 lb

## 2024-01-29 DIAGNOSIS — I1A0 Resistant hypertension: Secondary | ICD-10-CM | POA: Diagnosis not present

## 2024-01-29 DIAGNOSIS — Z9861 Coronary angioplasty status: Secondary | ICD-10-CM | POA: Diagnosis not present

## 2024-01-29 DIAGNOSIS — I2510 Atherosclerotic heart disease of native coronary artery without angina pectoris: Secondary | ICD-10-CM | POA: Diagnosis not present

## 2024-01-29 DIAGNOSIS — E1169 Type 2 diabetes mellitus with other specified complication: Secondary | ICD-10-CM

## 2024-01-29 DIAGNOSIS — E785 Hyperlipidemia, unspecified: Secondary | ICD-10-CM

## 2024-01-29 NOTE — Patient Instructions (Addendum)
 Medication Instructions:   No changes *If you need a refill on your cardiac medications before your next appointment, please call your pharmacy*   Lab Work: Not needed If you have labs (blood work) drawn today and your tests are completely normal, you will receive your results only by: MyChart Message (if you have MyChart) OR A paper copy in the mail If you have any lab test that is abnormal or we need to change your treatment, we will call you to review the results.   Testing/Procedures: Not needed   Follow-Up: At [facility], you and your health needs are our priority.  As part of our continuing mission to provide you with exceptional heart care, we have created designated Provider Care Teams.  These Care Teams include your primary Cardiologist (physician) and Advanced Practice Providers (APPs -  Physician Assistants and Nurse Practitioners) who all work together to provide you with the care you need, when you need it.     Your next appointment:   12 month(s)  The format for your next appointment:   In Person  Provider:   Bernadene Person, NP    Then, Bryan Lemma, MD will plan to see you again in 24 month(s).   Other Instructions  s

## 2024-01-29 NOTE — Progress Notes (Signed)
 Cardiology Office Note:  .   Date:  02/03/2024  ID:  Nicholas Hamilton, DOB [birth year], MRN [id number] PCP: Bertha Broad, MD  [facility] Providers Cardiologist:  Randene Bustard, MD     Chief Complaint  Patient presents with   Follow-up    Today in annual follow-up.  Doing well.   Coronary Artery Disease    No angina    Patient Profile: .     Nicholas Hamilton is an obese 78 y.o. male with a PMH summarized below who presents here for delayed annual follow-up at the request of Bertha Broad, MD.  CAD (2001): BMS-RCA =-> (2003) DES x 2 to LCx) Myoview 2006 false positive => repeat cath showed widely patent stents. HTN HLD DM-2 Hypothyroidism    Nicholas Hamilton was last seen on November 21, 2022 by Marlana Silvan, NP I had last seen him in January 2023).  He was doing well with no active angina or heart failure symptoms.  Was on stable doses of medications and no changes made.  BP well-controlled, plan for 1 year follow-up.  Subjective  Discussed the use of AI scribe software for clinical note transcription with the patient, who gave verbal consent to proceed.  History of Present Illness History of Present Illness Nicholas Hamilton is a 78 year old male with coronary artery disease who presents for a routine follow-up visit.  He has coronary artery disease and is currently on aspirin 81 mg and rosuvastatin, alternating between 40 mg three days a week and 20 mg on the other days. No chest pain, shortness of breath, or palpitations recently. His last stents were placed in 2003, and he had a stress test and heart catheterization in 2006.  His diabetes management has been fluctuating since his illness with the flu in January 2025, which led to significant weakness and a two-week period of not eating, followed by another two weeks of weakness. Morning blood sugar levels have been between 73 and 93, and his last A1c was 7.2. He is currently taking Jardiance 25 mg and  Amaryl 2 mg for diabetes management.  For hypertension, he is on carvedilol 12.5 mg twice a day, amlodipine 2.5 mg, and lisinopril 40 mg with HCTZ 12.5 mg. No issues with dizziness, syncope, or leg pain on exertion.  He is unsure of his exact Synthroid dose for hypothyroidism but believes he is taking 150 mcg. He plans to confirm this dosage.  He takes gabapentin twice a day for foot pain related to peripheral neuropathy. He also takes fish oil 1 gram twice a day and uses Claritin and Flonase as needed for allergies, with no recent issues reported.  He is recovering from the flu and working on regaining strength by returning to the [facility] for exercise. He recently got new glasses, which improved his vision. Recent lab work showed a total cholesterol of 160, triglycerides of 231, HDL of 48, and LDL of 66. Kidney function tests showed a potassium level of 4.8 and creatinine of 1.4. No stroke-like symptoms, sudden weakness, or changes in vision.  ROS:  Review of Systems - Negative except recovering from Flu in January - getting back into the gym ; mild spider veins    Objective   Medications - Synthroid 175 mcg (alternate Mon-Sat and Sun) - Jardiance 25 mg daily; - Amaryl 2 mg; Glucophage 1000 mg - 500 mg a.m. and 1000 mg p.m. - Crestor alternating 40 mg on Monday Wednesday Friday and 20 mg  on Tuesday Thursday Saturday Sunday-; Fish oil 1 g twice a day;- Fenofibrate 200 mg daily - Aspirin 81 mg - Carvedilol 12.5 mg twice a day;- Amlodipine 2.5 mg; - Lisinopril 40 mg daily; HCTZ 12.5 mg daily - Gabapentin 300 mg twice a day - Prilosec 20 mg daily - Claritin 10 mg (as needed); - Flonase spray (rarely use) - SL NTG 0.4 mg as needed  Studies Reviewed: Nicholas Hamilton   EKG Interpretation Date/Time:  Monday January 29 2024 14:54:33 EDT Ventricular Rate:  61 PR Interval:  254 QRS Duration:  98 QT Interval:  420 QTC Calculation: 422 R Axis:   102  Text Interpretation: Sinus rhythm with 1st degree A-V block  Rightward axis When compared with ECG of 20-Sep-2015 15:05, No significant change was found Confirmed by Randene Bustard ([id number]) on 01/29/2024 2:59:46 PM     No results found for: "CHOL", "HDL", "LDLCALC", "LDLDIRECT", "TRIG", "CHOLHDL" Last lipids were from January 2023: TC 152, TG 185, HDL 39, LDL 76.  BUN 16/Cr 1.5.  Glucose 144  LABS Total cholesterol: 160 mg/dL (13/05/6577) Triglycerides: 231 mg/dL (46/96/2952) HDL: 48 mg/dL (84/13/2440) LDL: 66 mg/dL (08/20/2535) Hemoglobin A1c: 7.2% (01/11/2024) Potassium: 4.8 mmol/L (01/11/2024) Creatinine: 1.4 mg/dL (64/40/3474)  DIAGNOSTIC EKG: Unchanged for the last ten years  Risk Assessment/Calculations:      Physical Exam:   VS:  BP 128/74   Pulse 61   Ht 5\' 9"  (1.753 m)   Wt 198 lb 9.6 oz (90.1 kg)   SpO2 98%   BMI 29.33 kg/m    Wt Readings from Last 3 Encounters:  01/29/24 198 lb 9.6 oz (90.1 kg)  02/07/23 198 lb (89.8 kg)  11/21/22 208 lb (94.3 kg)    GEN: Well nourished, well groomed;  in no acute distress; mild truncal obesity - otw healthy appearing NECK: No JVD; No carotid bruits CARDIAC: Normal S1, S2; RRR, no murmurs, rubs, gallops RESPIRATORY:  Clear to auscultation without rales, wheezing or rhonchi ; nonlabored, good air movement. ABDOMEN: Soft, non-tender, non-distended EXTREMITIES:  No edema; No deformity     ASSESSMENT AND PLAN: .    Problem List Items Addressed This Visit       Cardiology Problems   CAD S/P PCI to RCA - 2001; Cx 2003 (Chronic)   Distant history of BMS PCI back in 2001 followed by DES PCI in 2003. Most recent stress test in 2006 was false positive therefore the plan is no further evaluation unless symptoms develop. Continues to be active with no no recent angina or dyspnea.   -Lipids well-managed with alternating doses of 40 and 20 mg rosuvastatin.  - Continue aspirin 81 mg daily. - Continue carvedilol 25 mg twice daily, and amlodipine 2.5 mg for antianginal benefit and Lisinopril 40 mg  daily for afterload reduction - Continue Jardiance 25 mg daily - Monitor for new symptoms of angina or dyspnea.      Hyperlipidemia associated with type 2 diabetes mellitus (HCC) (Chronic)   Hyperlipidemia with hypertriglyceridemia Triglycerides at 231 mg/dL, LDL at 66 mg/dL.  Stable with current rosuvastatin regimen &  fenofibrate. - Continue current rosuvastatin dosing schedule (alternating 20 mg 4 days/week and 40 mg Monday Wednesday Friday). - Monitor lipid levels regularly.  A1c at 7.2%. Blood glucose fluctuates due to recent flu. Morning glucose 73-93 mg/dL. Expect stabilization post-flu. - Continue current diabetes medications: Jardiance 25 mg, metformin 500 mg a.m.-1000 mg p.m., and Amaryl 2 mg. - Monitor blood glucose levels regularly.      Resistant hypertension -  Primary (Chronic)   Well-controlled with multiple medications:  Carvedilol 12.5 mg twice daily, amlodipine 2.5 mg daily, and lisinopril 40 mg daily with HCTZ 12.5 mg daily.  Stable blood pressure readings. - Continue current antihypertensive regimen.      Relevant Orders   EKG 12-Lead (Completed)    Follow-Up: Return in about 1 year (around 01/28/2025) for Follow-up with APP or routine follow up with me (then alternate APP - MD). 12 month -Marlana Silvan NP 24 Alvino Aye ,MD Overall condition good. Encouraged to resume physical activity post-flu. No immediate need for further cardiac evaluation unless symptoms develop. - Call back with levothyroxine dosing information.    Signed, Arleen Lacer, MD, MS Randene Bustard, M.D., M.S. Interventional Cardiologist  [facility]  Pager # [phone] Phone # [phone] [address] [place], [place] [address]

## 2024-02-03 ENCOUNTER — Encounter: Payer: Self-pay | Admitting: Cardiology

## 2024-02-03 NOTE — Assessment & Plan Note (Signed)
 Well-controlled with multiple medications:  Carvedilol 12.5 mg twice daily, amlodipine 2.5 mg daily, and lisinopril 40 mg daily with HCTZ 12.5 mg daily.  Stable blood pressure readings. - Continue current antihypertensive regimen.

## 2024-02-03 NOTE — Assessment & Plan Note (Signed)
 Distant history of BMS PCI back in 2001 followed by DES PCI in 2003. Most recent stress test in 2006 was false positive therefore the plan is no further evaluation unless symptoms develop. Continues to be active with no no recent angina or dyspnea.   -Lipids well-managed with alternating doses of 40 and 20 mg rosuvastatin.  - Continue aspirin 81 mg daily. - Continue carvedilol 25 mg twice daily, and amlodipine 2.5 mg for antianginal benefit and Lisinopril 40 mg daily for afterload reduction - Continue Jardiance 25 mg daily - Monitor for new symptoms of angina or dyspnea.

## 2024-02-03 NOTE — Assessment & Plan Note (Signed)
 Hyperlipidemia with hypertriglyceridemia Triglycerides at 231 mg/dL, LDL at 66 mg/dL.  Stable with current rosuvastatin regimen &  fenofibrate. - Continue current rosuvastatin dosing schedule (alternating 20 mg 4 days/week and 40 mg Monday Wednesday Friday). - Monitor lipid levels regularly.  A1c at 7.2%. Blood glucose fluctuates due to recent flu. Morning glucose 73-93 mg/dL. Expect stabilization post-flu. - Continue current diabetes medications: Jardiance 25 mg, metformin 500 mg a.m.-1000 mg p.m., and Amaryl 2 mg. - Monitor blood glucose levels regularly.

## 2024-04-16 ENCOUNTER — Encounter: Payer: Self-pay | Admitting: Medical

## 2024-04-16 ENCOUNTER — Ambulatory Visit: Attending: Medical | Admitting: Medical

## 2024-04-16 VITALS — BP 122/68 | HR 54 | Ht 69.0 in | Wt 211.2 lb

## 2024-04-16 DIAGNOSIS — I509 Heart failure, unspecified: Secondary | ICD-10-CM | POA: Diagnosis not present

## 2024-04-16 DIAGNOSIS — I2510 Atherosclerotic heart disease of native coronary artery without angina pectoris: Secondary | ICD-10-CM | POA: Diagnosis not present

## 2024-04-16 DIAGNOSIS — R600 Localized edema: Secondary | ICD-10-CM | POA: Diagnosis not present

## 2024-04-16 DIAGNOSIS — R0602 Shortness of breath: Secondary | ICD-10-CM

## 2024-04-16 DIAGNOSIS — R001 Bradycardia, unspecified: Secondary | ICD-10-CM

## 2024-04-16 DIAGNOSIS — I10 Essential (primary) hypertension: Secondary | ICD-10-CM

## 2024-04-16 DIAGNOSIS — Z79899 Other long term (current) drug therapy: Secondary | ICD-10-CM

## 2024-04-16 DIAGNOSIS — E782 Mixed hyperlipidemia: Secondary | ICD-10-CM

## 2024-04-16 MED ORDER — CARVEDILOL 6.25 MG PO TABS: 6.2500 mg | ORAL_TABLET | Freq: Two times a day (BID) | ORAL | 3 refills | Status: DC

## 2024-04-16 MED ORDER — FUROSEMIDE 20 MG PO TABS
20.0000 mg | ORAL_TABLET | Freq: Every day | ORAL | 3 refills | Status: DC
Start: 2024-04-16 — End: 2024-04-22

## 2024-04-16 NOTE — Patient Instructions (Signed)
 Medication Instructions:   Your physician recommends the following medication changes.  DECREASE: Carvedilol  (COREG ) from 12.5 mg twice daily to 6.25 mg twice daily with meals  Please take all other medications as directed   *If you need a refill on your cardiac medications before your next appointment, please call your pharmacy*  Lab Work:  Your provider would like for you to have following labs drawn today CMET, CBC, BNP, TSH     If you have labs (blood work) drawn today and your tests are completely normal, you will receive your results only by: MyChart Message (if you have MyChart) OR A paper copy in the mail If you have any lab test that is abnormal or we need to change your treatment, we will call you to review the results.  Testing/Procedures:   Your physician has requested that you have an echocardiogram. Echocardiography is a painless test that uses sound waves to create images of your heart. It provides your doctor with information about the size and shape of your heart and how well your heart's chambers and valves are working.   You may receive an ultrasound enhancing agent through an IV if needed to better visualize your heart during the echo. This procedure takes approximately one hour.  There are no restrictions for this procedure.  This will take place at:      Please note: We ask at that you not bring children with you during ultrasound (echo/ vascular) testing. Due to room size and safety concerns, children are not allowed in the ultrasound rooms during exams. Our front office staff cannot provide observation of children in our lobby area while testing is being conducted. An adult accompanying a patient to their appointment will only be allowed in the ultrasound room at the discretion of the ultrasound technician under special circumstances. We apologize for any inconvenience.     Please report to Radiology at the [facility] Main Entrance 30 minutes  early for your test.  [address] [place], [place] [address]  How to Prepare for Your Cardiac PET/CT Stress Test:  Nothing to eat or drink, except water, 3 hours prior to arrival time.  NO caffeine/decaffeinated products, or chocolate 12 hours prior to arrival. (Please note decaffeinated beverages (teas/coffees) still contain caffeine).  If you have caffeine within 12 hours prior, the test will need to be rescheduled.  Medication instructions: Do not take erectile dysfunction medications for 72 hours prior to test (sildenafil, tadalafil) Do not take nitrates (isosorbide mononitrate, Ranexa) the day before or day of test Do not take tamsulosin the day before or morning of test Hold theophylline containing medications for 12 hours. Hold Dipyridamole 48 hours prior to the test.  Diabetic Preparation: If able to eat breakfast prior to 3 hour fasting, you may take all medications, including your insulin. Do not worry if you miss your breakfast dose of insulin - start at your next meal. If you do not eat prior to 3 hour fast-Hold all diabetes (oral and insulin) medications. Patients who wear a continuous glucose monitor MUST remove the device prior to scanning.  You may take your remaining medications with water.  NO perfume, cologne or lotion on chest or abdomen area. FEMALES - Please avoid wearing dresses to this appointment.  Total time is 1 to 2 hours; you may want to bring reading material for the waiting time.  IF YOU THINK YOU MAY BE PREGNANT, OR ARE NURSING PLEASE INFORM THE TECHNOLOGIST.  In preparation for  your appointment, medication and supplies will be purchased.  Appointment availability is limited, so if you need to cancel or reschedule, please call the Radiology Department Scheduler at [phone] 24 hours in advance to avoid a cancellation fee of $100.00  What to Expect When you Arrive:  Once you arrive and check in for your appointment, you will be taken to a  preparation room within the Radiology Department.  A technologist or Nurse will obtain your medical history, verify that you are correctly prepped for the exam, and explain the procedure.  Afterwards, an IV will be started in your arm and electrodes will be placed on your skin for EKG monitoring during the stress portion of the exam. Then you will be escorted to the PET/CT scanner.  There, staff will get you positioned on the scanner and obtain a blood pressure and EKG.  During the exam, you will continue to be connected to the EKG and blood pressure machines.  A small, safe amount of a radioactive tracer will be injected in your IV to obtain a series of pictures of your heart along with an injection of a stress agent.    After your Exam:  It is recommended that you eat a meal and drink a caffeinated beverage to counter act any effects of the stress agent.  Drink plenty of fluids for the remainder of the day and urinate frequently for the first couple of hours after the exam.  Your doctor will inform you of your test results within 7-10 business days.  For more information and frequently asked questions, please visit our website: [email or web address]  For questions about your test or how to prepare for your test, please call: Cardiac Imaging Nurse Navigators Office: [phone]   Follow-Up: At [facility], you and your health needs are our priority.  As part of our continuing mission to provide you with exceptional heart care, our providers are all part of one team.  This team includes your primary Cardiologist (physician) and Advanced Practice Providers or APPs (Physician Assistants and Nurse Practitioners) who all work together to provide you with the care you need, when you need it.  Your next appointment:  at [facility], [place], [place]  2 week(s)   and 6 month(s)  Provider:    Raphael Bring, PA-C, Damien Braver, NP, or Glendia Ferrier, PA-C  in 2 weeks. Then Follow Up  with Dr. Anner in 6 months

## 2024-04-16 NOTE — Progress Notes (Unsigned)
 Cardiology Office Note   Date:  04/17/2024  ID:  Nicholas Hamilton, DOB 04/19/1946, MRN [id number] PCP: Yolande Toribio MATSU, MD   [facility] Providers Cardiologist:  Alm Clay, MD   History of Present Illness Nicholas Hamilton is a 78 y.o. male with a history of CAD status post BMS to the RCA in 2001, DES x 2 to the left circumflex in 2003, sinus bradycardia, hypertension, hyperlipidemia, diabetes type 2, hypothyroidism, Baker's cyst, and obesity who presents for follow-up for CAD.  The patient had remote cath and MPI in 2006 (can see reports in chart). The patient was last seen 01/29/2024 by Dr. Clay.  Patient denied anginal symptoms.  Today, the patient reports swelling for the last 3 weeks. Noted breathing is very short, he is having trouble doing normal activities. He denies chest pain. Before this he ws going to the [facility] and was doing good. He has minimal orthostatic symptoms. Weight was 198lbs two months. Today weight 211lbs, but weight has been down to 192lbs.   Studies Reviewed EKG Interpretation Date/Time:  Tuesday April 16 2024 14:50:37 EDT Ventricular Rate:  51 PR Interval:  300 QRS Duration:  100 QT Interval:  436 QTC Calculation: 401 R Axis:   34  Text Interpretation: Sinus bradycardia with 1st degree A-V block with occasional Premature ventricular complexes When compared with ECG of 29-Jan-2024 14:54, Premature ventricular complexes are now Present Questionable change in QRS axis Confirmed by Franchester, Danil Wedge ([id number]) on 04/16/2024 2:52:52 PM    Heart cath in 2006  LVEF 50% Mild LV dysfunction, widely patent stents, high grade stenosis in a verty small posterior lateral branch with a large L>R collateral bed in the same area  Risk APhysical Exam VS:  BP 122/68   Pulse (!) 54   Ht 5' 9 (1.753 m)   Wt 211 lb 3.2 oz (95.8 kg)   SpO2 94%   BMI 31.19 kg/m        Wt Readings from Last 3 Encounters:  04/16/24 211 lb 3.2 oz (95.8 kg)  01/29/24 198 lb 9.6 oz  (90.1 kg)  02/07/23 198 lb (89.8 kg)    GEN: Well nourished, well developed in no acute distress NECK: No JVD; No carotid bruits CARDIAC: RRR, no murmurs, rubs, gallops RESPIRATORY:  Clear to auscultation without rales, wheezing or rhonchi  ABDOMEN: Soft, non-tender, non-distended EXTREMITIES:  2-3+ lower leg edema up to his thighs; No deformity   ASSESSMENT AND PLAN  LLE/SOB Acute heart failure Heart cath in 2006 reported LVEF 50%. No imaging since that time. The patient reports 3 weeks of lower leg swelling and worsening SOB.  Swelling is now up into his thighs. He denies any chest pain. Lungs sounds clear. Weight has gone up 198>211 lbs (but unclear dry weight). Patient takes hydrochlorothiazide.  I will stop this and start Lasix 20 mg daily.  I will check BMP, CMET, CBC and TSH. I will check an echo and Cardiac PET stress (given CAD history). Continue lisinopril, Coreg , and Jardiance.  CAD with remote stenting No chest pain reported, but he does report SOB as above. EKG with known SB and 1st degree AV block, and PVC. I will check a Cardiac PET stress test as above. Continue ASA daily, Coreg , lisinopril, Crestor  and SL NTG.  HTN BP is normal today, continue current medications.   HLD Need to repeat lipid panel, can do at follow-up. LDL 61 in 2023. Continue Crestor  alternating 40mg  M,W, F and 20mg  the other days.  Sinus bradycardia EKG shows SB with HR 51 and PRI , which is longer than prior. I will decrease Coreg  to 6.25mg BID.     Informed Consent   Shared Decision Making/Informed Consent The risks [chest pain, shortness of breath, cardiac arrhythmias, dizziness, blood pressure fluctuations, myocardial infarction, stroke/transient ischemic attack, nausea, vomiting, allergic reaction, radiation exposure, metallic taste sensation and life-threatening complications (estimated to be 1 in 10,000)], benefits (risk stratification, diagnosing coronary artery disease, treatment  guidance) and alternatives of a cardiac PET stress test were discussed in detail with Nicholas Hamilton and he agrees to proceed.     Dispo: Follow-up in 2 weeks, patient would like to follow-up in [place]  Signed, Morris Longenecker VEAR Fishman, PA-C

## 2024-04-17 ENCOUNTER — Telehealth: Payer: Self-pay | Admitting: Cardiology

## 2024-04-17 LAB — BRAIN NATRIURETIC PEPTIDE

## 2024-04-17 NOTE — Telephone Encounter (Signed)
 Wife stated patient wants a provider switch from Dr. Anner to Dr. Lavona.  Please confirm.

## 2024-04-18 ENCOUNTER — Ambulatory Visit: Payer: Self-pay | Admitting: Medical

## 2024-04-18 NOTE — Telephone Encounter (Signed)
 Okay

## 2024-04-19 ENCOUNTER — Inpatient Hospital Stay (HOSPITAL_COMMUNITY)
Admission: EM | Admit: 2024-04-19 | Discharge: 2024-04-22 | DRG: 291 | Disposition: A | Discharge status: Home / Self Care | Attending: Internal Medicine | Admitting: Internal Medicine

## 2024-04-19 ENCOUNTER — Other Ambulatory Visit: Payer: Self-pay

## 2024-04-19 ENCOUNTER — Emergency Department (HOSPITAL_COMMUNITY)

## 2024-04-19 ENCOUNTER — Encounter (HOSPITAL_COMMUNITY): Payer: Self-pay

## 2024-04-19 ENCOUNTER — Inpatient Hospital Stay (HOSPITAL_COMMUNITY)

## 2024-04-19 DIAGNOSIS — I509 Heart failure, unspecified: Secondary | ICD-10-CM | POA: Diagnosis not present

## 2024-04-19 DIAGNOSIS — I2510 Atherosclerotic heart disease of native coronary artery without angina pectoris: Secondary | ICD-10-CM | POA: Diagnosis present

## 2024-04-19 DIAGNOSIS — Z85828 Personal history of other malignant neoplasm of skin: Secondary | ICD-10-CM | POA: Diagnosis not present

## 2024-04-19 DIAGNOSIS — Z823 Family history of stroke: Secondary | ICD-10-CM

## 2024-04-19 DIAGNOSIS — I440 Atrioventricular block, first degree: Secondary | ICD-10-CM | POA: Diagnosis present

## 2024-04-19 DIAGNOSIS — E1169 Type 2 diabetes mellitus with other specified complication: Secondary | ICD-10-CM | POA: Diagnosis present

## 2024-04-19 DIAGNOSIS — Z880 Allergy status to penicillin: Secondary | ICD-10-CM

## 2024-04-19 DIAGNOSIS — E119 Type 2 diabetes mellitus without complications: Secondary | ICD-10-CM

## 2024-04-19 DIAGNOSIS — Z6831 Body mass index (BMI) 31.0-31.9, adult: Secondary | ICD-10-CM | POA: Diagnosis not present

## 2024-04-19 DIAGNOSIS — I480 Paroxysmal atrial fibrillation: Secondary | ICD-10-CM | POA: Diagnosis present

## 2024-04-19 DIAGNOSIS — T501X5A Adverse effect of loop [high-ceiling] diuretics, initial encounter: Secondary | ICD-10-CM | POA: Diagnosis not present

## 2024-04-19 DIAGNOSIS — Z8249 Family history of ischemic heart disease and other diseases of the circulatory system: Secondary | ICD-10-CM | POA: Diagnosis not present

## 2024-04-19 DIAGNOSIS — Z79890 Hormone replacement therapy (postmenopausal): Secondary | ICD-10-CM | POA: Diagnosis not present

## 2024-04-19 DIAGNOSIS — M7989 Other specified soft tissue disorders: Secondary | ICD-10-CM | POA: Diagnosis not present

## 2024-04-19 DIAGNOSIS — Z7982 Long term (current) use of aspirin: Secondary | ICD-10-CM

## 2024-04-19 DIAGNOSIS — N179 Acute kidney failure, unspecified: Secondary | ICD-10-CM | POA: Diagnosis not present

## 2024-04-19 DIAGNOSIS — E0590 Thyrotoxicosis, unspecified without thyrotoxic crisis or storm: Secondary | ICD-10-CM | POA: Diagnosis present

## 2024-04-19 DIAGNOSIS — I340 Nonrheumatic mitral (valve) insufficiency: Secondary | ICD-10-CM | POA: Diagnosis present

## 2024-04-19 DIAGNOSIS — Z9861 Coronary angioplasty status: Secondary | ICD-10-CM | POA: Diagnosis not present

## 2024-04-19 DIAGNOSIS — E039 Hypothyroidism, unspecified: Secondary | ICD-10-CM | POA: Diagnosis present

## 2024-04-19 DIAGNOSIS — Z7984 Long term (current) use of oral hypoglycemic drugs: Secondary | ICD-10-CM | POA: Diagnosis not present

## 2024-04-19 DIAGNOSIS — G9341 Metabolic encephalopathy: Secondary | ICD-10-CM

## 2024-04-19 DIAGNOSIS — I952 Hypotension due to drugs: Secondary | ICD-10-CM | POA: Diagnosis not present

## 2024-04-19 DIAGNOSIS — F17220 Nicotine dependence, chewing tobacco, uncomplicated: Secondary | ICD-10-CM | POA: Diagnosis present

## 2024-04-19 DIAGNOSIS — R001 Bradycardia, unspecified: Secondary | ICD-10-CM | POA: Diagnosis present

## 2024-04-19 DIAGNOSIS — I130 Hypertensive heart and chronic kidney disease with heart failure and stage 1 through stage 4 chronic kidney disease, or unspecified chronic kidney disease: Secondary | ICD-10-CM | POA: Diagnosis present

## 2024-04-19 DIAGNOSIS — Z955 Presence of coronary angioplasty implant and graft: Secondary | ICD-10-CM | POA: Diagnosis not present

## 2024-04-19 DIAGNOSIS — E785 Hyperlipidemia, unspecified: Secondary | ICD-10-CM | POA: Diagnosis present

## 2024-04-19 DIAGNOSIS — N1832 Chronic kidney disease, stage 3b: Secondary | ICD-10-CM | POA: Diagnosis present

## 2024-04-19 DIAGNOSIS — N183 Chronic kidney disease, stage 3 unspecified: Secondary | ICD-10-CM | POA: Diagnosis not present

## 2024-04-19 DIAGNOSIS — I10 Essential (primary) hypertension: Secondary | ICD-10-CM | POA: Diagnosis not present

## 2024-04-19 DIAGNOSIS — Z833 Family history of diabetes mellitus: Secondary | ICD-10-CM | POA: Diagnosis not present

## 2024-04-19 DIAGNOSIS — K219 Gastro-esophageal reflux disease without esophagitis: Secondary | ICD-10-CM | POA: Diagnosis present

## 2024-04-19 DIAGNOSIS — I5031 Acute diastolic (congestive) heart failure: Secondary | ICD-10-CM

## 2024-04-19 DIAGNOSIS — J9601 Acute respiratory failure with hypoxia: Secondary | ICD-10-CM | POA: Diagnosis present

## 2024-04-19 DIAGNOSIS — E1122 Type 2 diabetes mellitus with diabetic chronic kidney disease: Secondary | ICD-10-CM | POA: Diagnosis present

## 2024-04-19 DIAGNOSIS — Z79899 Other long term (current) drug therapy: Secondary | ICD-10-CM | POA: Diagnosis not present

## 2024-04-19 DIAGNOSIS — I1A0 Resistant hypertension: Secondary | ICD-10-CM | POA: Diagnosis present

## 2024-04-19 DIAGNOSIS — I5033 Acute on chronic diastolic (congestive) heart failure: Secondary | ICD-10-CM | POA: Diagnosis present

## 2024-04-19 DIAGNOSIS — E66811 Obesity, class 1: Secondary | ICD-10-CM | POA: Diagnosis present

## 2024-04-19 DIAGNOSIS — I4892 Unspecified atrial flutter: Secondary | ICD-10-CM | POA: Diagnosis present

## 2024-04-19 DIAGNOSIS — R0602 Shortness of breath: Secondary | ICD-10-CM | POA: Diagnosis present

## 2024-04-19 DIAGNOSIS — Z8673 Personal history of transient ischemic attack (TIA), and cerebral infarction without residual deficits: Secondary | ICD-10-CM

## 2024-04-19 LAB — HEPATIC FUNCTION PANEL
ALT: 10 U/L (ref 0–44)
AST: 19 U/L (ref 15–41)
Albumin: 3.1 g/dL — ABNORMAL LOW (ref 3.5–5.0)
Alkaline Phosphatase: 20 U/L — ABNORMAL LOW (ref 38–126)
Bilirubin, Direct: 0.2 mg/dL (ref 0.0–0.2)
Indirect Bilirubin: 0.5 mg/dL (ref 0.3–0.9)
Total Bilirubin: 0.7 mg/dL (ref 0.0–1.2)
Total Protein: 6.7 g/dL (ref 6.5–8.1)

## 2024-04-19 LAB — BRAIN NATRIURETIC PEPTIDE: B Natriuretic Peptide: 663.5 pg/mL — ABNORMAL HIGH (ref 0.0–100.0)

## 2024-04-19 LAB — CBC
HCT: 35.3 % — ABNORMAL LOW (ref 39.0–52.0)
HCT: 33.7 % — ABNORMAL LOW (ref 39.0–52.0)
Hematocrit: 33.1 % — ABNORMAL LOW (ref 37.5–51.0)
Hemoglobin: 11.2 g/dL — ABNORMAL LOW (ref 13.0–17.0)
Hemoglobin: 10.5 g/dL — ABNORMAL LOW (ref 13.0–17.0)
Hemoglobin: 10.4 g/dL — ABNORMAL LOW (ref 13.0–17.7)
MCH: 29.2 pg (ref 26.0–34.0)
MCH: 28.9 pg (ref 26.0–34.0)
MCH: 28.8 pg (ref 26.6–33.0)
MCHC: 31.7 g/dL (ref 30.0–36.0)
MCHC: 31.2 g/dL (ref 30.0–36.0)
MCHC: 31.4 g/dL — ABNORMAL LOW (ref 31.5–35.7)
MCV: 92.2 fL (ref 80.0–100.0)
MCV: 92.8 fL (ref 80.0–100.0)
MCV: 92 fL (ref 79–97)
Platelets: 194 10*3/uL (ref 150–400)
Platelets: 168 10*3/uL (ref 150–400)
Platelets: 173 10*3/uL (ref 150–450)
RBC: 3.83 MIL/uL — ABNORMAL LOW (ref 4.22–5.81)
RBC: 3.63 MIL/uL — ABNORMAL LOW (ref 4.22–5.81)
RBC: 3.61 x10E6/uL — ABNORMAL LOW (ref 4.14–5.80)
RDW: 14.5 % (ref 11.5–15.5)
RDW: 14.5 % (ref 11.5–15.5)
RDW: 12.6 % (ref 11.6–15.4)
WBC: 9.9 10*3/uL (ref 4.0–10.5)
WBC: 8.1 10*3/uL (ref 4.0–10.5)
WBC: 5.5 10*3/uL (ref 3.4–10.8)
nRBC: 0 % (ref 0.0–0.2)
nRBC: 0 % (ref 0.0–0.2)

## 2024-04-19 LAB — PROTIME-INR
INR: 1.3 — ABNORMAL HIGH (ref 0.8–1.2)
Prothrombin Time: 16.4 s — ABNORMAL HIGH (ref 11.4–15.2)

## 2024-04-19 LAB — BASIC METABOLIC PANEL WITH GFR
Anion gap: 9 (ref 5–15)
BUN/Creatinine Ratio: 10 (ref 10–24)
BUN: 21 mg/dL (ref 8–23)
BUN: 20 mg/dL (ref 8–27)
CO2: 22 mmol/L (ref 22–32)
CO2: 20 mmol/L (ref 20–29)
Calcium: 9.8 mg/dL (ref 8.9–10.3)
Calcium: 10.3 mg/dL — ABNORMAL HIGH (ref 8.6–10.2)
Chloride: 107 mmol/L (ref 98–111)
Chloride: 106 mmol/L (ref 96–106)
Creatinine, Ser: 1.95 mg/dL — ABNORMAL HIGH (ref 0.61–1.24)
Creatinine, Ser: 1.94 mg/dL — ABNORMAL HIGH (ref 0.76–1.27)
GFR, Estimated: 35 mL/min — ABNORMAL LOW (ref >60)
Glucose, Bld: 220 mg/dL — ABNORMAL HIGH (ref 70–99)
Glucose: 166 mg/dL — ABNORMAL HIGH (ref 70–99)
Potassium: 4 mmol/L (ref 3.5–5.1)
Potassium: 4.3 mmol/L (ref 3.5–5.2)
Sodium: 138 mmol/L (ref 135–145)
Sodium: 140 mmol/L (ref 134–144)
eGFR: 35 mL/min/{1.73_m2} — ABNORMAL LOW (ref >59)

## 2024-04-19 LAB — ECHOCARDIOGRAM COMPLETE
Area-P 1/2: 3.36 cm2
Calc EF: 43.1 %
Height: 69 in
S' Lateral: 3.5 cm
Single Plane A2C EF: 37.9 %
Single Plane A4C EF: 48.9 %
Weight: 3386.27 [oz_av]

## 2024-04-19 LAB — I-STAT VENOUS BLOOD GAS, ED
Acid-base deficit: 3 mmol/L — ABNORMAL HIGH (ref 0.0–2.0)
Bicarbonate: 23.6 mmol/L (ref 20.0–28.0)
Calcium, Ion: 1.29 mmol/L (ref 1.15–1.40)
HCT: 32 % — ABNORMAL LOW (ref 39.0–52.0)
Hemoglobin: 10.9 g/dL — ABNORMAL LOW (ref 13.0–17.0)
O2 Saturation: 97 %
Potassium: 4.1 mmol/L (ref 3.5–5.1)
Sodium: 142 mmol/L (ref 135–145)
TCO2: 25 mmol/L (ref 22–32)
pCO2, Ven: 47.5 mmHg (ref 44–60)
pH, Ven: 7.304 (ref 7.25–7.43)
pO2, Ven: 100 mmHg — ABNORMAL HIGH (ref 32–45)

## 2024-04-19 LAB — I-STAT CHEM 8, ED
BUN: 32 mg/dL — ABNORMAL HIGH (ref 8–23)
Calcium, Ion: 1.24 mmol/L (ref 1.15–1.40)
Chloride: 108 mmol/L (ref 98–111)
Creatinine, Ser: 2 mg/dL — ABNORMAL HIGH (ref 0.61–1.24)
Glucose, Bld: 212 mg/dL — ABNORMAL HIGH (ref 70–99)
HCT: 35 % — ABNORMAL LOW (ref 39.0–52.0)
Hemoglobin: 11.9 g/dL — ABNORMAL LOW (ref 13.0–17.0)
Potassium: 5 mmol/L (ref 3.5–5.1)
Sodium: 139 mmol/L (ref 135–145)
TCO2: 23 mmol/L (ref 22–32)

## 2024-04-19 LAB — TROPONIN I (HIGH SENSITIVITY)
Troponin I (High Sensitivity): 15 ng/L (ref <18)
Troponin I (High Sensitivity): 34 ng/L — ABNORMAL HIGH (ref <18)
Troponin I (High Sensitivity): 38 ng/L — ABNORMAL HIGH (ref <18)
Troponin I (High Sensitivity): 50 ng/L — ABNORMAL HIGH (ref <18)

## 2024-04-19 LAB — MRSA NEXT GEN BY PCR, NASAL: MRSA by PCR Next Gen: NOT DETECTED

## 2024-04-19 LAB — MAGNESIUM: Magnesium: 1.7 mg/dL (ref 1.7–2.4)

## 2024-04-19 LAB — CREATININE, SERUM
Creatinine, Ser: 2.17 mg/dL — ABNORMAL HIGH (ref 0.61–1.24)
GFR, Estimated: 30 mL/min — ABNORMAL LOW (ref >60)

## 2024-04-19 LAB — GLUCOSE, CAPILLARY
Glucose-Capillary: 94 mg/dL (ref 70–99)
Glucose-Capillary: 144 mg/dL — ABNORMAL HIGH (ref 70–99)
Glucose-Capillary: 149 mg/dL — ABNORMAL HIGH (ref 70–99)

## 2024-04-19 LAB — CBG MONITORING, ED
Glucose-Capillary: 133 mg/dL — ABNORMAL HIGH (ref 70–99)
Glucose-Capillary: 125 mg/dL — ABNORMAL HIGH (ref 70–99)

## 2024-04-19 LAB — I-STAT CG4 LACTIC ACID, ED: Lactic Acid, Venous: 1.8 mmol/L (ref 0.5–1.9)

## 2024-04-19 LAB — LACTIC ACID, PLASMA
Lactic Acid, Venous: 1.8 mmol/L (ref 0.5–1.9)
Lactic Acid, Venous: 1.1 mmol/L (ref 0.5–1.9)

## 2024-04-19 LAB — TSH
TSH: 4.3 u[IU]/mL (ref 0.450–4.500)
TSH: 3.153 u[IU]/mL (ref 0.350–4.500)

## 2024-04-19 MED ORDER — OMEGA-3-ACID ETHYL ESTERS 1 G PO CAPS
1.0000 g | ORAL_CAPSULE | Freq: Two times a day (BID) | ORAL | Status: DC
  Administered 2024-04-19 – 2024-04-22 (×7): 1 g via ORAL
  Filled 2024-04-19 (×8): qty 1

## 2024-04-19 MED ORDER — NOREPINEPHRINE 4 MG/250ML-% IV SOLN
0.0000 ug/min | INTRAVENOUS | Status: DC
  Administered 2024-04-19: 6 ug/min via INTRAVENOUS
  Filled 2024-04-19: qty 250

## 2024-04-19 MED ORDER — HEPARIN SODIUM (PORCINE) 5000 UNIT/ML IJ SOLN
5000.0000 [IU] | Freq: Three times a day (TID) | INTRAMUSCULAR | Status: DC
  Administered 2024-04-19 – 2024-04-22 (×9): 5000 [IU] via SUBCUTANEOUS
  Filled 2024-04-19 (×9): qty 1

## 2024-04-19 MED ORDER — PERFLUTREN LIPID MICROSPHERE
1.0000 mL | INTRAVENOUS | Status: AC | PRN
  Administered 2024-04-19: 4 mL via INTRAVENOUS

## 2024-04-19 MED ORDER — CHLORHEXIDINE GLUCONATE CLOTH 2 % EX PADS
6.0000 | MEDICATED_PAD | Freq: Every day | CUTANEOUS | Status: DC
  Administered 2024-04-19 – 2024-04-22 (×3): 6 via TOPICAL

## 2024-04-19 MED ORDER — ASPIRIN 81 MG PO TBEC
81.0000 mg | DELAYED_RELEASE_TABLET | Freq: Every day | ORAL | Status: DC
  Administered 2024-04-19 – 2024-04-21 (×3): 81 mg via ORAL
  Filled 2024-04-19 (×3): qty 1

## 2024-04-19 MED ORDER — NOREPINEPHRINE 4 MG/250ML-% IV SOLN: 0.0000 ug/min | INTRAVENOUS | Status: DC

## 2024-04-19 MED ORDER — LEVOTHYROXINE SODIUM 75 MCG PO TABS
150.0000 ug | ORAL_TABLET | Freq: Every day | ORAL | Status: DC
  Administered 2024-04-20 – 2024-04-22 (×3): 150 ug via ORAL
  Filled 2024-04-19 (×3): qty 2

## 2024-04-19 MED ORDER — FUROSEMIDE 10 MG/ML IJ SOLN
40.0000 mg | Freq: Once | INTRAMUSCULAR | Status: AC
  Administered 2024-04-19: 40 mg via INTRAVENOUS
  Filled 2024-04-19: qty 4

## 2024-04-19 MED ORDER — INSULIN ASPART 100 UNIT/ML IJ SOLN
0.0000 [IU] | Freq: Three times a day (TID) | INTRAMUSCULAR | Status: DC
  Administered 2024-04-19 (×2): 1 [IU] via SUBCUTANEOUS
  Administered 2024-04-20: 2 [IU] via SUBCUTANEOUS

## 2024-04-19 MED ORDER — ORAL CARE MOUTH RINSE: 15.0000 mL | OROMUCOSAL | Status: DC | PRN

## 2024-04-19 MED ORDER — GABAPENTIN 300 MG PO CAPS: 300.0000 mg | ORAL_CAPSULE | Freq: Every day | ORAL | Status: DC

## 2024-04-19 MED ORDER — ROSUVASTATIN CALCIUM 20 MG PO TABS
20.0000 mg | ORAL_TABLET | Freq: Every day | ORAL | Status: DC
  Administered 2024-04-19 – 2024-04-22 (×4): 20 mg via ORAL
  Filled 2024-04-19 (×4): qty 1

## 2024-04-19 MED ORDER — SODIUM CHLORIDE 0.9 % IV SOLN: 250.0000 mL | INTRAVENOUS | Status: AC

## 2024-04-19 MED ORDER — FENOFIBRATE 160 MG PO TABS
160.0000 mg | ORAL_TABLET | Freq: Every day | ORAL | Status: DC
  Administered 2024-04-19 – 2024-04-22 (×4): 160 mg via ORAL
  Filled 2024-04-19 (×5): qty 1

## 2024-04-19 NOTE — Progress Notes (Signed)
 Echocardiogram 2D Echocardiogram has been performed.  Damien FALCON Mccartney Chuba RDCS 04/19/2024, 9:47 AM

## 2024-04-19 NOTE — Progress Notes (Signed)
 BLE venous exam is complete. LC, RVT

## 2024-04-19 NOTE — Consult Note (Signed)
 NAME:  Nicholas Hamilton, MRN:  [id number], DOB:  [birth year], LOS: 0 ADMISSION DATE:  04/19/2024, CONSULTATION DATE:  04/19/24 REFERRING MD:  Franky CHIEF COMPLAINT:  Dyspnea   History of Present Illness:  Nicholas Hamilton is a 78 y.o. male who has a PMH as outlined below including not limited to CAD status post BMS to the RCA in 2001, DES x 2 to the left circumflex in 2003, sinus bradycardia, hypertension, hypothyroidism, DM2, CKD stage III.  Recently saw cardiology office 6/24 for increase in lower extremity edema and dyspnea x 3 weeks.  He was found to have CHF exacerbation was started on 20 mg of Lasix  with plans for an outpatient echo and PET scan.  He had also reported weight gain from 198 to 211 pounds.  On 6/26, symptoms worsened to the point that he had to call EMS overnight.  He presented to [facility] ED early AM 6/27 with main complaint of dyspnea.  On EMS arrival, he was hypoxic to 80% in the field with improvement to 90s on NRB.  Due to increased work of breathing in the ED, he was placed on BiPAP and was given 40 mg of Lasix .  CXR revealed interstitial pulmonary edema.  He was initially admitted by [facility]; however, shortly after receiving his Lasix , he became hypotensive with SBP in the 80s.  Given CHF, fluids were deferred.  He was subsequently started on norepinephrine and PCCM was called in consultation for upgraded admission to ICU  Pt and wife state that he has been attempting to see cardiology at outpt for some time and has a scheduled echo July 2 as well as cardiac perfusion scan. They endorse that pt has had increasing dyspnea with movement but does state he is still able to lay down in bed without orthopnea. No recent fever, chills, n/v/d. LE edema has precipitated cardiac w/u. No le venous duplex done as of yet. Pt never had issue with swelling prior.    Pertinent  Medical History:  has CAD S/P PCI to RCA - 2001; Cx 2003; Resistant hypertension; Hyperlipidemia associated with type  2 diabetes mellitus (HCC); Obesity (BMI 30-39.9); Lumbar spondylosis; Strain of left pectoralis muscle; Sinus bradycardia by electrocardiogram; Ankle impingement syndrome, right; Pain and swelling of right lower leg; Acute CHF (congestive heart failure) (HCC); ARF (acute renal failure) (HCC); Acute respiratory failure with hypoxia (HCC); Diabetes mellitus type 2 in nonobese [facility]); and Hypothyroidism on their problem list.   Significant Hospital Events: Including procedures, antibiotic start and stop dates in addition to other pertinent events   6/27 admit.  Interim History / Subjective:    Objective:  Blood pressure (!) 121/47, pulse (!) 30, temperature 97.9 F (36.6 C), resp. rate 19, height 5' 9 (1.753 m), weight 96 kg, SpO2 98%.    FiO2 (%):  [40 %] 40 % PEEP:  [5 cmH20] 5 cmH20 Pressure Support:  [10 cmH20] 10 cmH20  No intake or output data in the 24 hours ending 04/19/24 0548 Filed Weights   04/19/24 0045  Weight: 96 kg     Physical Exam: General: awake alert reclining in bed in nad Neuro: no focal deficits HEENT: ncat eomi, perrla, mmmp Cardiovascular: irreg Lungs: ctab Abdomen: soft nt nd bs + Musculoskeletal: no c/c ++edema to thigh bilaterally  Skin: no noted lesions warm/dry GU: deferred    Labs/imaging personally reviewed:  CXR 6/27 > interstitial pulmonary edema.  Assessment & Plan:   Acute CHF exacerbation - status post 40 mg of Lasix   in ED with resultant hypotension. Lactic acid and troponin reassuring, not consistent with true shock state, rather just after effects of diuresis. Hx CAD status post BMS to RCA 2001, DES x 2 to left circumflex 2003. Hx HTN, sinus bradycardia. - Get echo (already ordered). - Depending on echo, might need central access for CVP's, co-ox monitoring. - Continue norepinephrine, goal MAP > 65. - Admission upgraded to ICU for now given NE requirements, hopefully to wean soon as he is currently hypertensive on 6 NE. - Hold  further diuresis for now. - Continue PTA ASA, Fenofibrate, Rosuvastatin  - Hold PTA Amlodipine, Carvedilol , Lisinopril, Furosemide , Jardiance. -cardiology to see as well.   LE edema:  -L worse than R previously -le venous duplex pending  Acute hypoxic respiratory failure - 2/2 above. - Continue BiPAP PRN for work of breathing. - Transition to nasal cannula, goal SpO2 >92%. - Holding further diuresis for now given hypotension.  Acute on chronic kidney disease stage III. - F/r renal ultrasound. - Strict I&O's. - Follow BMP.  Hx Hypothyroidism - TSH WNL this admit (3.153). - F/u on TSH. - Continue PTA Synthroid.  Acute metabolic encephalopathy - presumed 2/2 hypotension. VBG reassuring. - Hold all sedating meds.  DM2. - SSI. - Hold PTA Glimepiride, Jardiance, Metformin     Best practice (evaluated daily):  Diet/type: Regular consistency (see orders) DVT prophylaxis: prophylactic heparin  Pressure ulcer(s): pressure ulcer assessment deferred  GI prophylaxis: N/A Lines: N/A Foley:  N/A Code Status:  full code Last date of multidisciplinary goals of care discussion: None yet.  Labs   CBC: Recent Labs  Lab 04/16/24 1519 04/19/24 0049 04/19/24 0235 04/19/24 0414 04/19/24 0447  WBC 5.5 9.9  --  8.1  --   HGB 10.4* 11.2* 11.9* 10.5* 10.9*  HCT 33.1* 35.3* 35.0* 33.7* 32.0*  MCV 92 92.2  --  92.8  --   PLT 173 194  --  168  --     Basic Metabolic Panel: Recent Labs  Lab 04/16/24 1519 04/19/24 0049 04/19/24 0235 04/19/24 0414 04/19/24 0447  NA 140 138 139  --  142  K 4.3 4.0 5.0  --  4.1  CL 106 107 108  --   --   CO2 20 22  --   --   --   GLUCOSE 166* 220* 212*  --   --   BUN 20 21 32*  --   --   CREATININE 1.94* 1.95* 2.00* 2.17*  --   CALCIUM  10.3* 9.8  --   --   --   MG  --  1.7  --   --   --    GFR: Estimated Creatinine Clearance: 32.1 mL/min (A) (by C-G formula based on SCr of 2.17 mg/dL (H)). Recent Labs  Lab 04/16/24 1519 04/19/24 0049  04/19/24 0414 04/19/24 0423  WBC 5.5 9.9 8.1  --   LATICACIDVEN  --   --  1.8 1.8    Liver Function Tests: Recent Labs  Lab 04/19/24 0414  AST 19  ALT 10  ALKPHOS 20*  BILITOT 0.7  PROT 6.7  ALBUMIN 3.1*   No results for input(s): LIPASE, AMYLASE in the last 168 hours. No results for input(s): AMMONIA in the last 168 hours.  ABG    Component Value Date/Time   HCO3 23.6 04/19/2024 0447   TCO2 25 04/19/2024 0447   ACIDBASEDEF 3.0 (H) 04/19/2024 0447   O2SAT 97 04/19/2024 0447     Coagulation Profile: Recent Labs  Lab 04/19/24 0049  INR 1.3*    Cardiac Enzymes: No results for input(s): CKTOTAL, CKMB, CKMBINDEX, TROPONINI in the last 168 hours.  HbA1C: No results found for: HGBA1C  CBG: Recent Labs  Lab 04/19/24 0446  GLUCAP 133*    Review of Systems:   As per HPI  Past Medical History:  He,  has a past medical history of CAD S/P percutaneous coronary angioplasty (10/25/1999), Diabetes mellitus type 2 with complications (HCC), GERD (gastroesophageal reflux disease), History of stress test - Exercise Cardiolite (01/22/2005), Hyperlipidemia LDL goal <70, Hypertension, essential, Hyperthyroidism, Squamous cell carcinoma of skin (04/27/2022), and Stroke (HCC).   Surgical History:   Past Surgical History:  Procedure Laterality Date   CARDIAC CATHETERIZATION  02/10/2005   EF 50% Mild left ventricular dysfuntion with a stent ejection fraction in posterior basilar segment hypokinesis, widely patent stents in the circumflex, High-grade stenosis is a very small posterior lateral branch with a large left to right collateral bed in the same area.   CORONARY ANGIOPLASTY WITH STENT PLACEMENT  04/29/2002   STENTS: A 2.5 x 28 cypher stents was placed in such a manner that both the proximal and distal portion of the obstruction in the circumflex vessel were well covered.The initial inflation was13 atmospheres for 60 seconds with final inflation being 15  atmospheres for 60 second. each inflation, the patient had burning in the back of his throat and chest pressure; it resolved with deflating the ballon   CORONARY ANGIOPLASTY WITH STENT PLACEMENT  06/30/2000   STENTS: A tetra stent 3.0 x 23, the stent was placed in such a manner that the dissection was well covered and deployed at 9 atmospheres for 59 seconds with the second inflation being 10 atmospheres for 57 seconds. the stent appeared to be slightly hyperexpanded ompared to the remainder of the vessel and there was a mild stent down at the distal portion of the stent.   KNEE SURGERY Left 1983     Social History:   reports that he quit smoking about 23 years ago. His smoking use included cigarettes. He started smoking about 63 years ago. He has a 40 pack-year smoking history. His smokeless tobacco use includes chew. He reports current alcohol  use. He reports that he does not use drugs.   Family History:  His family history includes Arthritis in his sister; Bell's palsy in his mother; Diabetes in his mother; Heart attack in his father and mother; Stroke in his father and mother.   Allergies Allergies  Allergen Reactions   Penicillins Other (See Comments)    Excessive sweating Has patient had a PCN reaction causing immediate rash, facial/tongue/throat swelling, SOB or lightheadedness with hypotension: No Has patient had a PCN reaction causing severe rash involving mucus membranes or skin necrosis: No Has patient had a PCN reaction that required hospitalization No Has patient had a PCN reaction occurring within the last 10 years: No If all of the above answers are NO, then may proceed with Cephalosporin use.     Home Medications  Prior to Admission medications   Medication Sig Start Date End Date Taking? Authorizing Provider  acetaminophen (TYLENOL) 500 MG tablet Take 500 mg by mouth 2 (two) times daily.    [provider]  amLODipine (NORVASC) 2.5 MG tablet Take 1 tablet by  mouth daily.    [provider]  aspirin EC 81 MG tablet Take 81 mg by mouth at bedtime.    [provider]  carvedilol  (COREG ) 6.25 MG tablet Take  1 tablet (6.25 mg total) by mouth 2 (two) times daily with a meal. 04/16/24   Furth, Cadence H, PA-C  Cyanocobalamin (VITAMIN B12 PO) Take by mouth daily.    [provider]  diclofenac Sodium (VOLTAREN) 1 % GEL 1 application 10/20/20   [provider]  fenofibrate micronized (LOFIBRA) 200 MG capsule Take 200 mg by mouth daily after breakfast.     [provider]  fish oil-omega-3 fatty acids 1000 MG capsule Take 1 g by mouth 2 (two) times daily.     [provider]  fluticasone (FLONASE) 50 MCG/ACT nasal spray Place 1 spray into both nostrils daily as needed (seasonal allergies).  08/21/15   [provider]  furosemide  (LASIX ) 20 MG tablet Take 1 tablet (20 mg total) by mouth daily. 04/16/24 07/15/24  Furth, Cadence H, PA-C  gabapentin  (NEURONTIN ) 300 MG capsule Take 300 mg by mouth daily. 05/23/21   [provider]  glimepiride (AMARYL) 2 MG tablet Take 2 mg by mouth daily before breakfast. 09/18/15   [provider]  JARDIANCE 25 MG TABS tablet  08/27/18   [provider]  levothyroxine (SYNTHROID) 150 MCG tablet TAKE 1 TABLET BY MOUTH DAILY MONDAY THROUGH SATURDAY    [provider]  levothyroxine (SYNTHROID, LEVOTHROID) 175 MCG tablet Take 175 mcg by mouth See admin instructions. Take 1 tablet (175 mcg) by mouth Monday thru Saturday (skip Sundays) 07/20/15   [provider]  Liniments PRECILLA) PADS Apply 1 each topically daily as needed (pain).    [provider]  lisinopril (PRINIVIL,ZESTRIL) 40 MG tablet Take 40 mg by mouth daily.    [provider]  loratadine (CLARITIN) 10 MG tablet Take 1 tablet by mouth daily. 08/02/13   [provider]  metFORMIN (GLUCOPHAGE) 1000 MG tablet Take 500-1,000 mg by mouth 2 (two) times  daily with a meal. Take 1/2 tablet (500 mg) by mouth with breakfast and 1 tablet (1000 mg) with supper    [provider]  nitroGLYCERIN  (NITROSTAT ) 0.4 MG SL tablet Place 1 tablet (0.4 mg total) under the tongue every 5 (five) minutes as needed for chest pain. 06/10/14   Anner Alm ORN, MD  omeprazole (PRILOSEC) 20 MG capsule Take 1 capsule by mouth daily.    [provider]  rosuvastatin  (CRESTOR ) 20 MG tablet TAKE ONE TABLET BY MOUTH DAILY 01/11/23   Anner Alm ORN, MD  rosuvastatin  (CRESTOR ) 40 MG tablet TAKE ONE BY MOUTH MONDAY , WEDNESDAY , FRIDAY AND 1/2 TABLET BY MOUTH ALL THE OTHR DAYS AS DIRECTED 03/08/23   Anner Alm ORN, MD  Vitamin D, Ergocalciferol, (DRISDOL) 1.25 MG (50000 UT) CAPS capsule  08/08/18   [provider]     Critical care time: 

## 2024-04-19 NOTE — H&P (Addendum)
 History and Physical    Nicholas Hamilton FMW:[id number] DOB: 11/26/1945 DOA: 04/19/2024  Patient coming from: Home.  Chief Complaint: Shortness of breath.  HPI: Nicholas Hamilton is a 78 y.o. male with history of CAD status post BMS to the RCA in 2001, DES x 2 to the left circumflex in 2003, sinus bradycardia, hypertension, hypothyroidism, diabetes mellitus type 2, chronic kidney disease stage III had recently noticing increasing shortness of breath with lower extremity edema over the last 3 weeks.  Patient had gone to the cardiology office on 04/16/2024 and patient was found to be in CHF was started on 20 mg p.o. Lasix  and plan to have outpatient 2D echo and PET scan.  Patient had gained weight from 198 to 211 pounds.  Tonight patient became acutely short of breath and was brought to the ER.  On the way patient was given 5 doses of sublingual nitroglycerin .  ED Course: In the ER chest x-ray shows pulmonary edema.  Patient was placed on BiPAP.  40 mg IV Lasix  was given.  Admitted for CHF exacerbation.  Shortly after giving Lasix  patient became hypotensive.  Patient is also lethargic after coming to the hospital.  Labs show troponin of 15 and 34.  EKG shows sinus bradycardia with VPCs.  ER physician discussed with cardiologist.  BNP 663.  Review of Systems: As per HPI, rest all negative.   Past Medical History:  Diagnosis Date   CAD S/P percutaneous coronary angioplasty 10/25/1999   PCI to RCA - 2001 (Tetra BMS 3.0 mm x 23 mm)  - BMS; Cx 2003 - Cypher DES 2.5 mm x 28 mm; CAATH 2006 - 80% stenosis in prox RPL with L-R collaterals, too small for PCI - Med Rx   Diabetes mellitus type 2 with complications (HCC)    CAD   GERD (gastroesophageal reflux disease)    History of stress test - Exercise Cardiolite 01/22/2005   mildly abnormal; 7-METS, peak HR 136 = 84% of max predicted; symptoms:significant dyspnea with exercise induced PVCs-ventricular bigeminy. nondiagnostic inferolateral ST-T  changes.EF 47% = Non-obstructive CAD with the exception of 80% RPL stenosis on cath   Hyperlipidemia LDL goal <70    Hypertension, essential    Hyperthyroidism    Squamous cell carcinoma of skin 04/27/2022   Right Frontal Scalp (in situ) (tx p bx)   Stroke [facility])     Past Surgical History:  Procedure Laterality Date   CARDIAC CATHETERIZATION  02/10/2005   EF 50% Mild left ventricular dysfuntion with a stent ejection fraction in posterior basilar segment hypokinesis, widely patent stents in the circumflex, High-grade stenosis is a very small posterior lateral branch with a large left to right collateral bed in the same area.   CORONARY ANGIOPLASTY WITH STENT PLACEMENT  04/29/2002   STENTS: A 2.5 x 28 cypher stents was placed in such a manner that both the proximal and distal portion of the obstruction in the circumflex vessel were well covered.The initial inflation was13 atmospheres for 60 seconds with final inflation being 15 atmospheres for 60 second. each inflation, the patient had burning in the back of his throat and chest pressure; it resolved with deflating the ballon   CORONARY ANGIOPLASTY WITH STENT PLACEMENT  06/30/2000   STENTS: A tetra stent 3.0 x 23, the stent was placed in such a manner that the dissection was well covered and deployed at 9 atmospheres for 59 seconds with the second inflation being 10 atmospheres for 57 seconds. the stent appeared to be slightly  hyperexpanded ompared to the remainder of the vessel and there was a mild stent down at the distal portion of the stent.   KNEE SURGERY Left 1983     reports that he quit smoking about 23 years ago. His smoking use included cigarettes. He started smoking about 63 years ago. He has a 40 pack-year smoking history. His smokeless tobacco use includes chew. He reports current alcohol  use. He reports that he does not use drugs.  Allergies  Allergen Reactions   Penicillins Other (See Comments)    Excessive sweating Has patient  had a PCN reaction causing immediate rash, facial/tongue/throat swelling, SOB or lightheadedness with hypotension: No Has patient had a PCN reaction causing severe rash involving mucus membranes or skin necrosis: No Has patient had a PCN reaction that required hospitalization No Has patient had a PCN reaction occurring within the last 10 years: No If all of the above answers are NO, then may proceed with Cephalosporin use.    Family History  Problem Relation Age of Onset   Diabetes Mother    Stroke Mother    Bell's palsy Mother    Heart attack Mother    Stroke Father        x 3   Heart attack Father    Arthritis Sister     Prior to Admission medications   Medication Sig Start Date End Date Taking? Authorizing Provider  acetaminophen (TYLENOL) 500 MG tablet Take 500 mg by mouth 2 (two) times daily.    [provider]  amLODipine (NORVASC) 2.5 MG tablet Take 1 tablet by mouth daily.    [provider]  aspirin EC 81 MG tablet Take 81 mg by mouth at bedtime.    [provider]  carvedilol  (COREG ) 6.25 MG tablet Take 1 tablet (6.25 mg total) by mouth 2 (two) times daily with a meal. 04/16/24   Furth, Cadence H, PA-C  Cyanocobalamin (VITAMIN B12 PO) Take by mouth daily.    [provider]  diclofenac Sodium (VOLTAREN) 1 % GEL 1 application 10/20/20   [provider]  fenofibrate micronized (LOFIBRA) 200 MG capsule Take 200 mg by mouth daily after breakfast.     [provider]  fish oil-omega-3 fatty acids 1000 MG capsule Take 1 g by mouth 2 (two) times daily.     [provider]  fluticasone (FLONASE) 50 MCG/ACT nasal spray Place 1 spray into both nostrils daily as needed (seasonal allergies).  08/21/15   [provider]  furosemide  (LASIX ) 20 MG tablet Take 1 tablet (20 mg total) by mouth daily. 04/16/24 07/15/24  Furth, Cadence H, PA-C  gabapentin  (NEURONTIN ) 300 MG capsule Take 300 mg by mouth daily. 05/23/21    [provider]  glimepiride (AMARYL) 2 MG tablet Take 2 mg by mouth daily before breakfast. 09/18/15   [provider]  JARDIANCE 25 MG TABS tablet  08/27/18   [provider]  levothyroxine (SYNTHROID) 150 MCG tablet TAKE 1 TABLET BY MOUTH DAILY MONDAY THROUGH SATURDAY    [provider]  levothyroxine (SYNTHROID, LEVOTHROID) 175 MCG tablet Take 175 mcg by mouth See admin instructions. Take 1 tablet (175 mcg) by mouth Monday thru Saturday (skip Sundays) 07/20/15   [provider]  Liniments PRECILLA) PADS Apply 1 each topically daily as needed (pain).    [provider]  lisinopril (PRINIVIL,ZESTRIL) 40 MG tablet Take 40 mg by mouth daily.    [provider]  loratadine (CLARITIN) 10 MG tablet Take 1  tablet by mouth daily. 08/02/13   [provider]  metFORMIN (GLUCOPHAGE) 1000 MG tablet Take 500-1,000 mg by mouth 2 (two) times daily with a meal. Take 1/2 tablet (500 mg) by mouth with breakfast and 1 tablet (1000 mg) with supper    [provider]  nitroGLYCERIN  (NITROSTAT ) 0.4 MG SL tablet Place 1 tablet (0.4 mg total) under the tongue every 5 (five) minutes as needed for chest pain. 06/10/14   Anner Alm ORN, MD  omeprazole (PRILOSEC) 20 MG capsule Take 1 capsule by mouth daily.    [provider]  rosuvastatin  (CRESTOR ) 20 MG tablet TAKE ONE TABLET BY MOUTH DAILY 01/11/23   Anner Alm ORN, MD  rosuvastatin  (CRESTOR ) 40 MG tablet TAKE ONE BY MOUTH MONDAY , WEDNESDAY , FRIDAY AND 1/2 TABLET BY MOUTH ALL THE OTHR DAYS AS DIRECTED 03/08/23   Anner Alm ORN, MD  Vitamin D, Ergocalciferol, (DRISDOL) 1.25 MG (50000 UT) CAPS capsule  08/08/18   [provider]    Physical Exam: Constitutional: Moderately built and nourished. Vitals:   04/19/24 0130 04/19/24 0200 04/19/24 0230 04/19/24 0300  BP: (!) 100/58 (!) 104/56  (!) 97/53  Pulse: (!) 56 (!) 37 (!) 39   Resp: 19 18 15 15   Temp:      SpO2:  100% 91% 100%   Weight:      Height:       Eyes: Anicteric no pallor. ENMT: No discharge from the ears eyes nose: Neck: No mass felt.  No neck rigidity. Respiratory: No rhonchi or crepitations. Cardiovascular: S1-S2 heard. Abdomen: Soft nontender bowel sound present. Musculoskeletal: Bilateral lower extremity edema present. Skin: No rash. Neurologic: Patient is lethargic and had to have sternal rub.  Pupils reacting to light. Psychiatric: Lethargic.   Labs on Admission: I have personally reviewed following labs and imaging studies  CBC: Recent Labs  Lab 04/16/24 1519 04/19/24 0049 04/19/24 0235  WBC 5.5 9.9  --   HGB 10.4* 11.2* 11.9*  HCT 33.1* 35.3* 35.0*  MCV 92 92.2  --   PLT 173 194  --    Basic Metabolic Panel: Recent Labs  Lab 04/16/24 1519 04/19/24 0049 04/19/24 0235  NA 140 138 139  K 4.3 4.0 5.0  CL 106 107 108  CO2 20 22  --   GLUCOSE 166* 220* 212*  BUN 20 21 32*  CREATININE 1.94* 1.95* 2.00*  CALCIUM  10.3* 9.8  --   MG  --  1.7  --    GFR: Estimated Creatinine Clearance: 34.8 mL/min (A) (by C-G formula based on SCr of 2 mg/dL (H)). Liver Function Tests: No results for input(s): AST, ALT, ALKPHOS, BILITOT, PROT, ALBUMIN in the last 168 hours. No results for input(s): LIPASE, AMYLASE in the last 168 hours. No results for input(s): AMMONIA in the last 168 hours. Coagulation Profile: Recent Labs  Lab 04/19/24 0049  INR 1.3*   Cardiac Enzymes: No results for input(s): CKTOTAL, CKMB, CKMBINDEX, TROPONINI in the last 168 hours. BNP (last 3 results) No results for input(s): PROBNP in the last 8760 hours. HbA1C: No results for input(s): HGBA1C in the last 72 hours. CBG: No results for input(s): GLUCAP in the last 168 hours. Lipid Profile: No results for input(s): CHOL, HDL, LDLCALC, TRIG, CHOLHDL, LDLDIRECT in the last 72 hours. Thyroid  Function Tests: Recent Labs    04/16/24 1519  TSH 4.300    Anemia Panel: No results for input(s): VITAMINB12, FOLATE, FERRITIN, TIBC, IRON, RETICCTPCT in the last 72 hours. Urine  analysis: No results found for: COLORURINE, APPEARANCEUR, LABSPEC, PHURINE, GLUCOSEU, HGBUR, BILIRUBINUR, KETONESUR, PROTEINUR, UROBILINOGEN, NITRITE, LEUKOCYTESUR Sepsis Labs: @LABRCNTIP (procalcitonin:4,lacticidven:4) )No results found for this or any previous visit (from the past 240 hours).   Radiological Exams on Admission: DG Chest Portable 1 View Result Date: 04/19/2024 CLINICAL DATA:  Dyspnea EXAM: PORTABLE CHEST 1 VIEW COMPARISON:  09/20/2015 FINDINGS: Moderate perihilar interstitial pulmonary edema, new since prior examination. No pneumothorax or pleural effusion. Cardiac size is within normal limits. No acute bone abnormality. IMPRESSION: 1. Moderate perihilar interstitial pulmonary edema. Electronically Signed   By: Dorethia Molt M.D.   On: 04/19/2024 02:16    EKG: Independently reviewed.  Sinus bradycardia with VPCs.  Assessment/Plan Principal Problem:   Acute respiratory failure with hypoxia (HCC) Active Problems:   CAD S/P PCI to RCA - 2001; Cx 2003   Sinus bradycardia by electrocardiogram   Acute CHF (congestive heart failure) (HCC)   ARF (acute renal failure) (HCC)    Acute respiratory failure with hypoxia secondary to CHF exacerbation no recent 2D echo.  Was given Lasix  40 mg IV following which patient has become hypotensive.  Plan is to start patient on Levophed.  Continue BiPAP.  Check VBG will consult critical care.  Will order 2D echo. Hypotension -      patient became hypotensive after Lasix  was given.  Patient had also received NTG on the way to the ER. Will hold all antihypertensives and diuretics for now.  Started on Levophed.  Check lactic acid levels and troponins. Acute encephalopathy -     patient became more lethargic after coming to the hospital.  Patient as per the patient's wife was conversing  normally.  Will check VBG and CBC stat. Hold gabapentin . CAD status post PCI -     will trend cardiac markers check 2D echo. Acute on chronic kidney disease stage III last creatinine in our system is in 2016.  Check UA renal ultrasound.  Could be cardiorenal syndrome.  Closely monitor. Diabetes mellitus type 2 no recent hemoglobin A1c.  On sliding scale coverage.  Takes Jardiance and metformin. Hypothyroidism on Synthroid.  Check TSH. Hypertension holding oral antihypertensives due to hypotension.  Patient takes lisinopril, Coreg , amlodipine. Sinus bradycardia check TSH hold beta-blockers.  Since patient has acute respiratory failure will need close monitoring more than 2 midnight stay.   DVT prophylaxis: Heparin. Code Status: Full code. Family Communication: Patient's wife. Disposition Plan: Patient likely may need to go to ICU. Consults called: Will consult critical care. Admission status: Inpatient.

## 2024-04-19 NOTE — ED Notes (Signed)
 Pt bp 80/37. This nurse dvised PA Sponsellar, provider reported to bedside.

## 2024-04-19 NOTE — ED Triage Notes (Signed)
 PT is from home brought in by EMS, PT called 911 for SOB, PT was 80% per EMS and they placed him on cpap and he came up to 94% PT is a CHF PT and PT states he dosent wear home O2. PT states he was started on lasix . Has been taking his meds. RT at bedside to place PT on a Bipap 40% o2

## 2024-04-19 NOTE — ED Provider Notes (Signed)
 [place] EMERGENCY DEPARTMENT AT [facility] Provider Note   CSN: [id number] Arrival date & time: 04/19/24  0037     Patient presents with: Shortness of Breath   Nicholas Hamilton is a 78 y.o. male with hx of CAD with PCI, CHF, HTN who presents to the ED on CPAP with EMS after waking from his sleep with respiratory distress. Was hypoxic to the 80%s in the field, improved to the 90s on NRB, WOB improved on CPAP per EMS. Patient was recently seen in the ED at [facility] on 6/24 and initiated on lasix  for BLE; Coreg  was also decreased to 6.25 mg. . Had nitroglycerin  x4 en route. Echo and cardiac CT pending, per cardiologist.   Level 5 caveat due to acuity of presentation of arrival. Placed on BiPAP at time of arrival.    HPI     Prior to Admission medications   Medication Sig Start Date End Date Taking? Authorizing Provider  acetaminophen (TYLENOL) 500 MG tablet Take 500 mg by mouth 2 (two) times daily.    [provider]  amLODipine (NORVASC) 2.5 MG tablet Take 1 tablet by mouth daily.    [provider]  aspirin EC 81 MG tablet Take 81 mg by mouth at bedtime.    [provider]  carvedilol  (COREG ) 6.25 MG tablet Take 1 tablet (6.25 mg total) by mouth 2 (two) times daily with a meal. 04/16/24   Furth, Cadence H, PA-C  Cyanocobalamin (VITAMIN B12 PO) Take by mouth daily.    [provider]  diclofenac Sodium (VOLTAREN) 1 % GEL 1 application 10/20/20   [provider]  fenofibrate micronized (LOFIBRA) 200 MG capsule Take 200 mg by mouth daily after breakfast.     [provider]  fish oil-omega-3 fatty acids 1000 MG capsule Take 1 g by mouth 2 (two) times daily.     [provider]  fluticasone (FLONASE) 50 MCG/ACT nasal spray Place 1 spray into both nostrils daily as needed (seasonal allergies).  08/21/15   [provider]  furosemide  (LASIX ) 20 MG tablet Take 1 tablet (20 mg total) by mouth daily. 04/16/24  07/15/24  Furth, Cadence H, PA-C  gabapentin  (NEURONTIN ) 300 MG capsule Take 300 mg by mouth daily. 05/23/21   [provider]  glimepiride (AMARYL) 2 MG tablet Take 2 mg by mouth daily before breakfast. 09/18/15   [provider]  JARDIANCE 25 MG TABS tablet  08/27/18   [provider]  levothyroxine (SYNTHROID) 150 MCG tablet TAKE 1 TABLET BY MOUTH DAILY MONDAY THROUGH SATURDAY    [provider]  levothyroxine (SYNTHROID, LEVOTHROID) 175 MCG tablet Take 175 mcg by mouth See admin instructions. Take 1 tablet (175 mcg) by mouth Monday thru Saturday (skip Sundays) 07/20/15   [provider]  Liniments PRECILLA) PADS Apply 1 each topically daily as needed (pain).    [provider]  lisinopril (PRINIVIL,ZESTRIL) 40 MG tablet Take 40 mg by mouth daily.    [provider]  loratadine (CLARITIN) 10 MG tablet Take 1 tablet by mouth daily. 08/02/13   [provider]  metFORMIN (GLUCOPHAGE) 1000 MG tablet Take 500-1,000 mg by mouth 2 (two) times daily with a meal. Take 1/2 tablet (500 mg) by mouth with breakfast and 1 tablet (1000 mg) with supper    [provider]  nitroGLYCERIN  (NITROSTAT ) 0.4 MG SL tablet Place 1 tablet (0.4 mg total) under the tongue every 5 (five) minutes as needed for chest pain. 06/10/14  Anner Alm ORN, MD  omeprazole (PRILOSEC) 20 MG capsule Take 1 capsule by mouth daily.    [provider]  rosuvastatin  (CRESTOR ) 20 MG tablet TAKE ONE TABLET BY MOUTH DAILY 01/11/23   Anner Alm ORN, MD  rosuvastatin  (CRESTOR ) 40 MG tablet TAKE ONE BY MOUTH MONDAY , WEDNESDAY , FRIDAY AND 1/2 TABLET BY MOUTH ALL THE OTHR DAYS AS DIRECTED 03/08/23   Anner Alm ORN, MD  Vitamin D, Ergocalciferol, (DRISDOL) 1.25 MG (50000 UT) CAPS capsule  08/08/18   [provider]    Allergies: Penicillins    Review of Systems  Constitutional: Negative.   HENT: Negative.    Respiratory:  Positive for shortness  of breath. Negative for cough and chest tightness.   Cardiovascular:  Positive for leg swelling. Negative for chest pain and palpitations.  Gastrointestinal: Negative.   Neurological: Negative.     Updated Vital Signs BP (!) 97/53   Pulse (!) 39   Temp 97.8 F (36.6 C)   Resp 15   Ht 5' 9 (1.753 m)   Wt 96 kg   SpO2 100%   BMI 31.25 kg/m   Physical Exam Vitals and nursing note reviewed.  Constitutional:      Appearance: He is not ill-appearing or toxic-appearing.  HENT:     Head: Normocephalic and atraumatic.     Mouth/Throat:     Mouth: Mucous membranes are moist.     Pharynx: No oropharyngeal exudate or posterior oropharyngeal erythema.   Eyes:     General:        Right eye: No discharge.        Left eye: No discharge.     Conjunctiva/sclera: Conjunctivae normal.     Pupils: Pupils are equal, round, and reactive to light.    Cardiovascular:     Rate and Rhythm: Normal rate and regular rhythm.     Pulses: Normal pulses.     Heart sounds: Normal heart sounds.  Pulmonary:     Effort: Tachypnea, accessory muscle usage and respiratory distress present.     Breath sounds: Examination of the right-upper field reveals rales. Examination of the left-upper field reveals rales. Examination of the right-middle field reveals rales. Examination of the left-middle field reveals rales. Rales present. No wheezing.  Chest:     Chest wall: No mass, tenderness or edema.  Abdominal:     General: Bowel sounds are normal. There is no distension.     Tenderness: There is no abdominal tenderness.   Musculoskeletal:        General: No deformity.     Cervical back: Neck supple.     Right lower leg: No tenderness. 2+ Pitting Edema present.     Left lower leg: No tenderness. 2+ Pitting Edema present.   Skin:    General: Skin is warm and dry.     Capillary Refill: Capillary refill takes less than 2 seconds.   Neurological:     General: No focal deficit present.     Mental Status: He  is alert and oriented to person, place, and time. Mental status is at baseline.   Psychiatric:        Mood and Affect: Mood normal.     (all labs ordered are listed, but only abnormal results are displayed) Labs Reviewed  BASIC METABOLIC PANEL WITH GFR - Abnormal; Notable for the following components:      Result Value   Glucose, Bld 220 (*)    Creatinine, Ser 1.95 (*)  GFR, Estimated 35 (*)    All other components within normal limits  BRAIN NATRIURETIC PEPTIDE - Abnormal; Notable for the following components:   B Natriuretic Peptide 663.5 (*)    All other components within normal limits  CBC - Abnormal; Notable for the following components:   RBC 3.83 (*)    Hemoglobin 11.2 (*)    HCT 35.3 (*)    All other components within normal limits  PROTIME-INR - Abnormal; Notable for the following components:   Prothrombin Time 16.4 (*)    INR 1.3 (*)    All other components within normal limits  I-STAT CHEM 8, ED - Abnormal; Notable for the following components:   BUN 32 (*)    Creatinine, Ser 2.00 (*)    Glucose, Bld 212 (*)    Hemoglobin 11.9 (*)    HCT 35.0 (*)    All other components within normal limits  MAGNESIUM  TROPONIN I (HIGH SENSITIVITY)  TROPONIN I (HIGH SENSITIVITY)    EKG: EKG Interpretation Date/Time:  Friday April 19 2024 00:45:25 EDT Ventricular Rate:  81 PR Interval:  298 QRS Duration:  105 QT Interval:  387 QTC Calculation: 450 R Axis:   104  Text Interpretation: Sinus rhythm Atrial premature complex Prolonged PR interval Right axis deviation Confirmed by Theadore Sharper [phone]) on 04/19/2024 1:10:34 AM  Radiology: [facility] Chest Portable 1 View Result Date: 04/19/2024 CLINICAL DATA:  Dyspnea EXAM: PORTABLE CHEST 1 VIEW COMPARISON:  09/20/2015 FINDINGS: Moderate perihilar interstitial pulmonary edema, new since prior examination. No pneumothorax or pleural effusion. Cardiac size is within normal limits. No acute bone abnormality. IMPRESSION: 1. Moderate  perihilar interstitial pulmonary edema. Electronically Signed   By: Dorethia Molt M.D.   On: 04/19/2024 02:16     .Critical Care  Performed by: Bobette Pleasant SAUNDERS, PA-C Authorized by: Bobette Pleasant SAUNDERS, PA-C   Critical care provider statement:    Critical care time (minutes):  45   Critical care was time spent personally by me on the following activities:  Development of treatment plan with patient or surrogate, discussions with consultants, evaluation of patient's response to treatment, examination of patient, obtaining history from patient or surrogate, ordering and performing treatments and interventions, ordering and review of laboratory studies, ordering and review of radiographic studies, pulse oximetry and re-evaluation of patient's condition    Medications Ordered in the ED  furosemide  (LASIX ) injection 40 mg (40 mg Intravenous Given 04/19/24 0111)    Clinical Course as of 04/19/24 0407  Fri Apr 19, 2024  0235 Work of breathing significantly proved on BiPAP, rales improving as well on auscultation.  Patient will require admission to the hospital for acute heart failure exacerbation with respiratory distress. [RS]  0249 Dr. Franky, hospitalist, is agreeable to admitting this patient to his service. I appreciate his collaboration in the care of this patient.  [RS]  0250 I was called to bedside by ED RN Dreac with concern for sudden decrease in patient's heart rate to the 40s.  When present at the bedside, patient remains easily arousable to voice though his blood pressure is much softer now in the low 90s systolic, previously 140s.  Patient also seems quite tired.  EDP Dr. Octavio presenting to the bedside.  Cardiology consult pending [RS]  [phone] Consult to Dr. Andi, cardiology fellow, who states that the patient's PVCs are causing faux-bradycardia, as they are perfused beats but can cause the appearance of bradycardia. As patient remains hemodynamically stable, no  indication for acute intervention  at this time.  [RS]  [phone] ED RN reports this patient's pressures in the 80s, MAP 52 at time of my arrival to the bedside. Recovering to 24, admitting physician Dr. Franky informed.  Discussed the case with him over the phone, he is requesting initiate Levophed for vasopressin, concern for developing cardiogenic shock?.  He will consult PCCM.  Appreciate collaboration in the care of this patient. [RS]    Clinical Course User Index [RS] Danni Shima, Pleasant SAUNDERS, PA-C                                 Medical Decision Making 78 y/o male who presents with concern for respiratory distress.   Tachypneic, hypoxic to 89% on RA, placed on BiPAP. BLE edema 2+ pitting up to the knee, no abdominal distension.   DDX includes but is not limited to CHF, CAD, COPD, dysrhythmia, ACS.   Amount and/or Complexity of Data Reviewed Labs: ordered. Radiology: ordered.  Risk Prescription drug management. Decision regarding hospitalization.    Patient will require admission to the hospital for CHF exacerbation with respiratory distress. Improving on BiPAP. Consult to hospitalist as above.   Benuel voiced understanding of his medical evaluation and treatment plan. Each of their questions answered to their expressed satisfaction. He is amenable to plan for admission at this time.   This chart was dictated using voice recognition software, Dragon. Despite the best efforts of this provider to proofread and correct errors, errors may still occur which can change documentation meaning.      Final diagnoses:  Acute congestive heart failure, unspecified heart failure type [facility])    ED Discharge Orders     None          Bobette Pleasant SAUNDERS, PA-C 04/19/24 0334    Theadore Ozell HERO, MD 04/19/24 [phone]

## 2024-04-19 NOTE — Progress Notes (Signed)
   04/19/24 0044  BiPAP/CPAP/SIPAP  $ Non-Invasive Ventilator  Non-Invasive Vent Set Up;Non-Invasive Vent Initial  $ Face Mask Medium Yes  BiPAP/CPAP/SIPAP Pt Type Adult  BiPAP/CPAP/SIPAP SERVO  Mask Type Full face mask  Dentures removed? Not applicable  Mask Size Medium  Set Rate 0 breaths/min  Respiratory Rate 32 breaths/min  IPAP 15 cmH20  EPAP 5 cmH2O  Pressure Support 10 cmH20  PEEP 5 cmH20  FiO2 (%) 40 %  Flow Rate 0 lpm  Minute Ventilation 18.6  Leak 43  Peak Inspiratory Pressure (PIP) 15  Tidal Volume (Vt) 664  Patient Home Machine No  Patient Home Mask No  Patient Home Tubing No  Auto Titrate No  Press High Alarm 30 cmH2O  Device Plugged into RED Power Outlet Yes  Oxygen Percent 40 %  BiPAP/CPAP /SiPAP Vitals  Temp 97.8 F (36.6 C)  Pulse Rate 82  Resp (!) 22  BP (!) 149/90  SpO2 100 %  Bilateral Breath Sounds Rales  MEWS Score/Color  MEWS Score 1  MEWS Score Color Green

## 2024-04-20 DIAGNOSIS — N1832 Chronic kidney disease, stage 3b: Secondary | ICD-10-CM | POA: Diagnosis not present

## 2024-04-20 DIAGNOSIS — E785 Hyperlipidemia, unspecified: Secondary | ICD-10-CM

## 2024-04-20 DIAGNOSIS — I5033 Acute on chronic diastolic (congestive) heart failure: Secondary | ICD-10-CM | POA: Diagnosis not present

## 2024-04-20 DIAGNOSIS — E039 Hypothyroidism, unspecified: Secondary | ICD-10-CM

## 2024-04-20 DIAGNOSIS — E66811 Obesity, class 1: Secondary | ICD-10-CM

## 2024-04-20 DIAGNOSIS — E1169 Type 2 diabetes mellitus with other specified complication: Secondary | ICD-10-CM

## 2024-04-20 DIAGNOSIS — I2510 Atherosclerotic heart disease of native coronary artery without angina pectoris: Secondary | ICD-10-CM | POA: Diagnosis not present

## 2024-04-20 DIAGNOSIS — I10 Essential (primary) hypertension: Secondary | ICD-10-CM | POA: Diagnosis not present

## 2024-04-20 LAB — BASIC METABOLIC PANEL WITH GFR
Anion gap: 10 (ref 5–15)
BUN: 25 mg/dL — ABNORMAL HIGH (ref 8–23)
CO2: 23 mmol/L (ref 22–32)
Calcium: 10.2 mg/dL (ref 8.9–10.3)
Chloride: 106 mmol/L (ref 98–111)
Creatinine, Ser: 1.84 mg/dL — ABNORMAL HIGH (ref 0.61–1.24)
GFR, Estimated: 37 mL/min — ABNORMAL LOW (ref >60)
Glucose, Bld: 115 mg/dL — ABNORMAL HIGH (ref 70–99)
Potassium: 3.9 mmol/L (ref 3.5–5.1)
Sodium: 139 mmol/L (ref 135–145)

## 2024-04-20 LAB — GLUCOSE, CAPILLARY
Glucose-Capillary: 114 mg/dL — ABNORMAL HIGH (ref 70–99)
Glucose-Capillary: 163 mg/dL — ABNORMAL HIGH (ref 70–99)

## 2024-04-20 LAB — HEMOGLOBIN A1C
Hgb A1c MFr Bld: 6.3 % — ABNORMAL HIGH (ref 4.8–5.6)
Mean Plasma Glucose: 134.11 mg/dL

## 2024-04-20 MED ORDER — MELATONIN 5 MG PO TABS
5.0000 mg | ORAL_TABLET | Freq: Every evening | ORAL | Status: DC | PRN
  Administered 2024-04-21: 5 mg via ORAL
  Filled 2024-04-20: qty 1

## 2024-04-20 MED ORDER — EMPAGLIFLOZIN 10 MG PO TABS
10.0000 mg | ORAL_TABLET | Freq: Every day | ORAL | Status: DC
  Administered 2024-04-20 – 2024-04-22 (×3): 10 mg via ORAL
  Filled 2024-04-20 (×3): qty 1

## 2024-04-20 MED ORDER — FUROSEMIDE 10 MG/ML IJ SOLN
40.0000 mg | Freq: Two times a day (BID) | INTRAMUSCULAR | Status: DC
  Administered 2024-04-20 – 2024-04-21 (×2): 40 mg via INTRAVENOUS
  Filled 2024-04-20 (×2): qty 4

## 2024-04-20 MED ORDER — PROCHLORPERAZINE EDISYLATE 10 MG/2ML IJ SOLN: 5.0000 mg | Freq: Four times a day (QID) | INTRAMUSCULAR | Status: DC | PRN

## 2024-04-20 MED ORDER — POLYETHYLENE GLYCOL 3350 17 G PO PACK: 17.0000 g | PACK | Freq: Every day | ORAL | Status: DC | PRN

## 2024-04-20 MED ORDER — HYDROXYZINE HCL 10 MG PO TABS
10.0000 mg | ORAL_TABLET | Freq: Three times a day (TID) | ORAL | Status: DC | PRN
  Administered 2024-04-20 – 2024-04-21 (×3): 10 mg via ORAL
  Filled 2024-04-20 (×3): qty 1

## 2024-04-20 MED ORDER — ACETAMINOPHEN 325 MG PO TABS: 650.0000 mg | ORAL_TABLET | Freq: Four times a day (QID) | ORAL | Status: DC | PRN

## 2024-04-20 NOTE — Assessment & Plan Note (Signed)
Calculated BMI is 31.2

## 2024-04-20 NOTE — Hospital Course (Signed)
 Mr. Milhorn was admitted to the hospital with the working diagnosis of heart failure exacerbation.   78 yo male with the past medical history of coronary artery disease, hypertension, hypothyroidism, T2DM, and CKD who presented with dyspnea. Reported 3 weeks of worsening lower extremity edema and dyspnea. Positive weight gain 3 lbs. On 06/24 he was evaluated at the outpatient cardiology office and was found volume overloaded, he got prescription for 20 mg furosemide  to take daily. Unfortunately his symptoms continue to worsen and on the day of admission he had severe dyspnea. EMS was called and he was found in respiratory distress and hypoxemic with 02 saturation 80% on room air. He was placed on Cpap and was transported to the ED.  In the ED he was placed on Bipap, 40% fi02, his blood pressure was 100/58, HR 56, RR 37 to 39 and 02 saturation 91% Lungs with no wheezing or rhonchi, heart with S1 and S2 present and regular with no gallops, rubs or murmurs, abdomen with no distention and positive lower extremity edema.   Na 138, K 4,0 Cl 107 bicarbonate 22 glucose 220, bun 21 cr 1,95  Mg 1,7  BNP 663.5  High sensitive troponin 15 and 34  Wbc 9.9 hgb 11.2 plt 194   Chest radiograph with cardiomegaly, bilateral hilar vascular congestion, with bilateral central interstitial infiltrates, no effusions.   EKG 81 bpm, right axis deviation, qtc 450, normal intervals, sinus rhythm with 1st degree AV block, no significant ST segment or T wave changes. Poor RR wave progression.   Patient was placed on furosemide  for diuresis. He developed hypotension, requiring vasopressor therapy.   06/27 Weaned off vasopressors  06/28 transfer to [facility]  06/29 blood pressure has been stable and he has responded well to diuresis. Resuming heart failure therapy  06/30 patient developed atrial fibrillation/ atrial flutter, rate controlled, placed on apixaban for anticoagulation.  Follow up as outpatient.

## 2024-04-20 NOTE — Assessment & Plan Note (Signed)
 Continue levothyroxine 

## 2024-04-20 NOTE — Assessment & Plan Note (Signed)
 Patient off vasopressors.  Continue diuresis and close blood pressure monitoring

## 2024-04-20 NOTE — Assessment & Plan Note (Signed)
 Basal serum cr around 2.0   Renal function with serum cr at 1,8 with K at 3,9 and serum bicarbonate at 23  Na 139  Resume diuresis with furosemide  and add SGLT 2 inh Follow up renal function and electrolytes in am.

## 2024-04-20 NOTE — Progress Notes (Addendum)
 Progress Note   Patient: Nicholas Hamilton FMW:[id number] DOB: 25-Jul-1946 DOA: 04/19/2024     1 DOS: the patient was seen and examined on 04/20/2024   Brief hospital course: Nicholas Hamilton was admitted to the hospital with the working diagnosis of heart failure exacerbation.   78 yo male with the past medical history of coronary artery disease, hypertension, hypothyroidism, T2DM, and CKD who presented with dyspnea. Reported 3 weeks of worsening lower extremity edema and dyspnea. Positive weight gain 3 lbs. On 06/24 he was evaluated at the outpatient cardiology office and was found volume overloaded, he got prescription for 20 mg furosemide  to take daily. Unfortunately his symptoms continue to worsen and on the day of admission he had severe dyspnea. EMS was called and he was found in respiratory distress and hypoxemic with 02 saturation 80% on room air. He was placed on Cpap and was transported to the ED.  In the ED he was placed on Bipap, 40% fi02, his blood pressure was 100/58, HR 56, RR 37 to 39 and 02 saturation 91% Lungs with no wheezing or rhonchi, heart with S1 and S2 present and regular with no gallops, rubs or murmurs, abdomen with no distention and positive lower extremity edema.   Na 138, K 4,0 Cl 107 bicarbonate 22 glucose 220, bun 21 cr 1,95  Mg 1,7  BNP 663.5  High sensitive troponin 15 and 34  Wbc 9.9 hgb 11.2 plt 194   Chest radiograph with cardiomegaly, bilateral hilar vascular congestion, with bilateral central interstitial infiltrates, no effusions.   EKG 81 bpm, right axis deviation, qtc 450, normal intervals, sinus rhythm with 1st degree AV block, no significant ST segment or T wave changes. Poor RR wave progression.   Patient was placed on furosemide  for diuresis. He developed hypotension, requiring vasopressor therapy.   06/27 Weaned off vasopressors  06/28 transfer to [facility]      Assessment and Plan: * Acute on chronic diastolic CHF (congestive heart failure)  (HCC) Echocardiogram with mildly reduced LV systolic function with EF 40 to 50%, global hypokinesis, moderate dilatation of LV cavity, mild LVH, RV systolic function preserved, LA with mild dilatation, mild to moderate mitral valve regurgitation,    Urine output 1,725  Systolic blood pressure 150 mmHg.   Plan to continue diuresis with furosemide  IV 40 mg bid Add SGLT 2 inh Close blood pressure monitoring before adding afterload reduction.   Acute hypoxemic respiratory failure due to acute cardiogenic pulmonary edema.  Now off Bipap.  His 02 saturation today is 95% on room air.  Acute metabolic encephalopathy has been ruled out.   CAD S/P PCI to RCA - 2001; Cx 2003 No chest pain, no acute coronary syndrome  Elevation in high sensitive troponin due to heart failure exacerbation.   Chronic kidney disease, stage 3b (HCC) Basal serum cr around 2.0   Renal function with serum cr at 1,8 with K at 3,9 and serum bicarbonate at 23  Na 139  Resume diuresis with furosemide  and add SGLT 2 inh Follow up renal function and electrolytes in am.   Essential hypertension Patient off vasopressors.  Continue diuresis and close blood pressure monitoring   Hypothyroidism Continue levothyroxine   Type 2 diabetes mellitus with hyperlipidemia (HCC) Glucose has been stable.  Fasting glucose today 115 mg/dl   Plan to hold on insulin and check as needed capillary glucose.  Continue with statin therapy   Obesity, class 1 Calculated BMI is 31.2    Subjective: Patient with no chest  pain, positive dyspnea and wheezing, edema has improved but not back to baseline   Physical Exam: Vitals:   04/20/24 0600 04/20/24 0700 04/20/24 0826 04/20/24 1146  BP: (!) 152/98 (!) 151/79 (!) 155/58 (!) 152/85  Pulse: (!) 55 64 (!) 59 71  Resp: 20 (!) 23 20 20   Temp:   98.5 F (36.9 C) 97.6 F (36.4 C)  TempSrc:   Oral Oral  SpO2: 93% 94% 94% 95%  Weight:      Height:       Neurology awake and alert ENT  with mild pallor Cardiovascular with S1 and S2 present and regular with no gallops or rubs, no murmurs, positive extra beats.  No JVD Respiratory with positive rales bilaterally with no wheezing or rhonchi ] Abdomen with no distention  Positive lower extremity edema +  Data Reviewed:    Family Communication: no family at the bedside   Disposition: Status is: Inpatient Remains inpatient appropriate because: IV diuresis   Planned Discharge Destination: Home     Author: Elidia Toribio Furnace, MD 04/20/2024 3:08 PM  For on call review [email or web address].

## 2024-04-20 NOTE — Assessment & Plan Note (Addendum)
 Echocardiogram with mildly reduced LV systolic function with EF 40 to 50%, global hypokinesis, moderate dilatation of LV cavity, mild LVH, RV systolic function preserved, LA with mild dilatation, mild to moderate mitral valve regurgitation,    Urine output 2,575 ml  Patient has lost about 11 kg since admission per documented weight.  Systolic blood pressure 150 mmHg.   Continue SGLT 2 inh, will resume carvedilol  and losartan Holding on mineralocorticoid receptor blocker due to reduced GFR.  Patient had one dose of IV furosemide  this morning.  Will transition to po furosemide  tomorrow.   Acute hypoxemic respiratory failure due to acute cardiogenic pulmonary edema.  Now off Bipap.  His 02 saturation today is 93% on room air.  Acute metabolic encephalopathy has been ruled out.

## 2024-04-20 NOTE — Assessment & Plan Note (Signed)
 No chest pain, no acute coronary syndrome  Elevation in high sensitive troponin due to heart failure exacerbation.  Old records personally reviewed: patient had BMS to the RCA in 2001 and DES x2 to the left circumflex in 2003.  Will continue aspirin and statin.  Follow up as outpatient.

## 2024-04-20 NOTE — Assessment & Plan Note (Signed)
 His glucose remained stable off insulin therapy   A the time of discharge he will resume metformin and glimepiride with close follow up as outpatient on capillary glucose.  Continue with statin and fenofibrate therapy

## 2024-04-20 NOTE — Plan of Care (Signed)
   Problem: Education: Goal: Ability to describe self-care measures that may prevent or decrease complications (Diabetes Survival Skills Education) will improve Outcome: Progressing

## 2024-04-21 DIAGNOSIS — I2510 Atherosclerotic heart disease of native coronary artery without angina pectoris: Secondary | ICD-10-CM | POA: Diagnosis not present

## 2024-04-21 DIAGNOSIS — N1832 Chronic kidney disease, stage 3b: Secondary | ICD-10-CM | POA: Diagnosis not present

## 2024-04-21 DIAGNOSIS — I10 Essential (primary) hypertension: Secondary | ICD-10-CM | POA: Diagnosis not present

## 2024-04-21 DIAGNOSIS — I5033 Acute on chronic diastolic (congestive) heart failure: Secondary | ICD-10-CM | POA: Diagnosis not present

## 2024-04-21 LAB — BASIC METABOLIC PANEL WITH GFR
Anion gap: 9 (ref 5–15)
BUN: 21 mg/dL (ref 8–23)
CO2: 26 mmol/L (ref 22–32)
Calcium: 10.4 mg/dL — ABNORMAL HIGH (ref 8.9–10.3)
Chloride: 105 mmol/L (ref 98–111)
Creatinine, Ser: 1.86 mg/dL — ABNORMAL HIGH (ref 0.61–1.24)
GFR, Estimated: 37 mL/min — ABNORMAL LOW (ref >60)
Glucose, Bld: 70 mg/dL (ref 70–99)
Potassium: 3.6 mmol/L (ref 3.5–5.1)
Sodium: 140 mmol/L (ref 135–145)

## 2024-04-21 LAB — GLUCOSE, CAPILLARY
Glucose-Capillary: 90 mg/dL (ref 70–99)
Glucose-Capillary: 156 mg/dL — ABNORMAL HIGH (ref 70–99)

## 2024-04-21 LAB — MAGNESIUM: Magnesium: 1.8 mg/dL (ref 1.7–2.4)

## 2024-04-21 MED ORDER — CARVEDILOL 6.25 MG PO TABS
6.2500 mg | ORAL_TABLET | Freq: Two times a day (BID) | ORAL | Status: DC
  Administered 2024-04-21 – 2024-04-22 (×3): 6.25 mg via ORAL
  Filled 2024-04-21 (×3): qty 1

## 2024-04-21 MED ORDER — MAGNESIUM SULFATE 2 GM/50ML IV SOLN
2.0000 g | Freq: Once | INTRAVENOUS | Status: AC
  Administered 2024-04-21: 2 g via INTRAVENOUS
  Filled 2024-04-21: qty 50

## 2024-04-21 MED ORDER — LOSARTAN POTASSIUM 25 MG PO TABS
25.0000 mg | ORAL_TABLET | Freq: Every day | ORAL | Status: DC
  Administered 2024-04-21 – 2024-04-22 (×2): 25 mg via ORAL
  Filled 2024-04-21 (×2): qty 1

## 2024-04-21 NOTE — Progress Notes (Signed)
 Progress Note   Patient: Nicholas Hamilton FMW:[id number] DOB: [birth year] DOA: 04/19/2024     2 DOS: the patient was seen and examined on 04/21/2024   Brief hospital course: Nicholas Hamilton was admitted to the hospital with the working diagnosis of heart failure exacerbation.   78 yo male with the past medical history of coronary artery disease, hypertension, hypothyroidism, T2DM, and CKD who presented with dyspnea. Reported 3 weeks of worsening lower extremity edema and dyspnea. Positive weight gain 3 lbs. On 06/24 he was evaluated at the outpatient cardiology office and was found volume overloaded, he got prescription for 20 mg furosemide  to take daily. Unfortunately his symptoms continue to worsen and on the day of admission he had severe dyspnea. EMS was called and he was found in respiratory distress and hypoxemic with 02 saturation 80% on room air. He was placed on Cpap and was transported to the ED.  In the ED he was placed on Bipap, 40% fi02, his blood pressure was 100/58, HR 56, RR 37 to 39 and 02 saturation 91% Lungs with no wheezing or rhonchi, heart with S1 and S2 present and regular with no gallops, rubs or murmurs, abdomen with no distention and positive lower extremity edema.   Na 138, K 4,0 Cl 107 bicarbonate 22 glucose 220, bun 21 cr 1,95  Mg 1,7  BNP 663.5  High sensitive troponin 15 and 34  Wbc 9.9 hgb 11.2 plt 194   Chest radiograph with cardiomegaly, bilateral hilar vascular congestion, with bilateral central interstitial infiltrates, no effusions.   EKG 81 bpm, right axis deviation, qtc 450, normal intervals, sinus rhythm with 1st degree AV block, no significant ST segment or T wave changes. Poor RR wave progression.   Patient was placed on furosemide  for diuresis. He developed hypotension, requiring vasopressor therapy.   06/27 Weaned off vasopressors  06/28 transfer to [facility]  06/29 blood pressure has been stable and he has responded well to diuresis. Resuming heart  failure therapy   Assessment and Plan: * Acute on chronic diastolic CHF (congestive heart failure) (HCC) Echocardiogram with mildly reduced LV systolic function with EF 40 to 50%, global hypokinesis, moderate dilatation of LV cavity, mild LVH, RV systolic function preserved, LA with mild dilatation, mild to moderate mitral valve regurgitation,    Urine output 2,575 ml  Patient has lost about 11 kg since admission per documented weight.  Systolic blood pressure 150 mmHg.   Continue SGLT 2 inh, will resume carvedilol  and losartan Holding on mineralocorticoid receptor blocker due to reduced GFR.  Patient had one dose of IV furosemide  this morning.  Will transition to po furosemide  tomorrow.   Acute hypoxemic respiratory failure due to acute cardiogenic pulmonary edema.  Now off Bipap.  His 02 saturation today is 93% on room air.  Acute metabolic encephalopathy has been ruled out.   CAD S/P PCI to RCA - 2001; Cx 2003 No chest pain, no acute coronary syndrome  Elevation in high sensitive troponin due to heart failure exacerbation.   Chronic kidney disease, stage 3b (HCC) Basal serum cr around 2.0   Improved volume status, renal function with serum cr at 1,86 with K at 3,5 and serum bicarbonate at 26  Na 140 and Mg 1.8   Continue SGLT 2 inh, and will add 2 g Mag sulfate.  Follow up renal function and electrolytes in am.  Resume RAAS inhibition with losartan (at home on high dose of lisinopril), possible transition to entresto as outpatient.   Essential hypertension  Blood pressure has been stable, will resume carvedilol  and will add losartan.  Follow up blood pressure today, if good toleration possible discharge home tomorrow   Hypothyroidism Continue levothyroxine   Type 2 diabetes mellitus with hyperlipidemia (HCC) Glucose has been stable.  Fasting glucose today 70 mg/dl   Continue with statin therapy   Obesity, class 1 Calculated BMI is 31.2      Subjective: Patient  with no chest pain or dyspnea, orthopnea or PND, no wheezing and improved lower extremity edema   Physical Exam: Vitals:   04/20/24 2017 04/20/24 2342 04/21/24 0433 04/21/24 0900  BP: (!) 151/83 (!) 142/60 (!) 144/50   Pulse: 65 76 67   Resp: 19 20 18 18   Temp: 97.9 F (36.6 C) 98.7 F (37.1 C) 97.6 F (36.4 C)   TempSrc: Oral Oral Oral Oral  SpO2: 99% 92% 93%   Weight:   85.3 kg   Height:       Neurology awake and alert ENT with no pallor or icterus Cardiovascular with S1 and S2 present and regular with no gallops or rubs, no murmurs No JVD Respiratory with no rales or wheezing, no rhonchi  Abdomen with no distention  No lower extremity edema  Data Reviewed:    Family Communication: no family at the bedside   Disposition: Status is: Inpatient Remains inpatient appropriate because: recovering heart failure exacerbation   Planned Discharge Destination: Home    Author: Elidia Toribio Furnace, MD 04/21/2024 9:06 AM  For on call review [email or web address].

## 2024-04-21 NOTE — Plan of Care (Signed)

## 2024-04-21 NOTE — Progress Notes (Signed)
 Patient experiencing some anxiety with hospital setting. Nicholas Hamilton says when he heard he would be at the hospital another 24 hours, he felt anxious. Patient was given atarax 10 mg, and felt better.  Patient observed with slight tremor in left hand and reports experiencing tremors in upper extremities intermittently.  Author: Velinda Server, RN 04/21/2024 4:24 PM

## 2024-04-22 ENCOUNTER — Other Ambulatory Visit (HOSPITAL_COMMUNITY): Payer: Self-pay

## 2024-04-22 ENCOUNTER — Telehealth (HOSPITAL_COMMUNITY): Payer: Self-pay | Admitting: Pharmacy Technician

## 2024-04-22 DIAGNOSIS — I4892 Unspecified atrial flutter: Secondary | ICD-10-CM | POA: Diagnosis not present

## 2024-04-22 DIAGNOSIS — I10 Essential (primary) hypertension: Secondary | ICD-10-CM | POA: Diagnosis not present

## 2024-04-22 DIAGNOSIS — I2510 Atherosclerotic heart disease of native coronary artery without angina pectoris: Secondary | ICD-10-CM | POA: Diagnosis not present

## 2024-04-22 DIAGNOSIS — I5033 Acute on chronic diastolic (congestive) heart failure: Secondary | ICD-10-CM | POA: Diagnosis not present

## 2024-04-22 LAB — BASIC METABOLIC PANEL WITH GFR
Anion gap: 10 (ref 5–15)
BUN: 23 mg/dL (ref 8–23)
CO2: 23 mmol/L (ref 22–32)
Calcium: 10.1 mg/dL (ref 8.9–10.3)
Chloride: 103 mmol/L (ref 98–111)
Creatinine, Ser: 2.04 mg/dL — ABNORMAL HIGH (ref 0.61–1.24)
GFR, Estimated: 33 mL/min — ABNORMAL LOW (ref >60)
Glucose, Bld: 98 mg/dL (ref 70–99)
Potassium: 3.7 mmol/L (ref 3.5–5.1)
Sodium: 136 mmol/L (ref 135–145)

## 2024-04-22 LAB — GLUCOSE, CAPILLARY: Glucose-Capillary: 163 mg/dL — ABNORMAL HIGH (ref 70–99)

## 2024-04-22 MED ORDER — CARVEDILOL 6.25 MG PO TABS: 6.2500 mg | ORAL_TABLET | Freq: Two times a day (BID) | ORAL | Status: DC

## 2024-04-22 MED ORDER — METOPROLOL TARTRATE 25 MG PO TABS: 25.0000 mg | ORAL_TABLET | Freq: Two times a day (BID) | ORAL | Status: DC

## 2024-04-22 MED ORDER — APIXABAN 5 MG PO TABS
5.0000 mg | ORAL_TABLET | Freq: Two times a day (BID) | ORAL | 0 refills | Status: DC
  Filled 2024-04-22: qty 60, 30d supply, fill #0

## 2024-04-22 MED ORDER — FUROSEMIDE 40 MG PO TABS
40.0000 mg | ORAL_TABLET | Freq: Every day | ORAL | 0 refills | Status: DC
  Filled 2024-04-22: qty 30, 30d supply, fill #0

## 2024-04-22 MED ORDER — LOSARTAN POTASSIUM 25 MG PO TABS
25.0000 mg | ORAL_TABLET | Freq: Every day | ORAL | 0 refills | Status: DC
  Filled 2024-04-22: qty 30, 30d supply, fill #0

## 2024-04-22 MED ORDER — FUROSEMIDE 40 MG PO TABS: 40.0000 mg | ORAL_TABLET | Freq: Every day | ORAL | Status: DC

## 2024-04-22 MED ORDER — APIXABAN 5 MG PO TABS: 5.0000 mg | ORAL_TABLET | Freq: Two times a day (BID) | ORAL | Status: DC

## 2024-04-22 NOTE — TOC CM/SW Note (Signed)
 Transition of Care [facility]) - Inpatient Brief Assessment   Patient Details  Name: Nicholas Hamilton MRN: [id number] Date of Birth: 01/24/1946  [facility] CM/SW Contact:    Waddell Barnie Rama, RN Phone Number: 04/22/2024, 9:55 AM   Clinical Narrative: From home with spouse, has PCP and insurance on file, states has no HH services in place at this time, has walker/cane at home.  States family member will transport them home at [employer] and family is support system, states gets medications from [facility].  Pta self ambulatory with walker /cane.   Patient gives this NCM permission to speak with wife.  He eats a low sodium diet and he has a scale at home for daily weights.    Transition of Care Asessment: Insurance and Status: Insurance coverage has been reviewed Patient has primary care physician: Yes Home environment has been reviewed: home with wife Prior level of function:: ambulatory with walker/cane Prior/Current Home Services: Current home services (walker/cane) Social Drivers of Health Review: SDOH reviewed no interventions necessary Readmission risk has been reviewed: Yes Transition of care needs: no transition of care needs at this time

## 2024-04-22 NOTE — TOC Transition Note (Addendum)
 Transition of Care [facility]) - Discharge Note   Patient Details  Name: Nicholas Hamilton MRN: [id number] Date of Birth: Mar 14, 1946  Transition of Care [facility]) CM/SW Contact:  Waddell Barnie Rama, RN Phone Number: 04/22/2024, 1:25 PM   Clinical Narrative:    For dc today, has no needs. He has transport.  He is starting eliquis, copay for refills will be  176.39 per [facility] pharmacy, most likely due to a deductible that has not been met.,  NCM informed patient of this information and to call his doctor if he has any issues .         Patient Goals and CMS Choice            Discharge Placement                       Discharge Plan and Services Additional resources added to the After Visit Summary for                                       Social Drivers of Health (SDOH) Interventions SDOH Screenings   Food Insecurity: No Food Insecurity (04/19/2024)  Housing: Low Risk  (04/19/2024)  Transportation Needs: No Transportation Needs (04/19/2024)  Utilities: Not At Risk (04/19/2024)  Social Connections: Socially Integrated (04/19/2024)  Tobacco Use: High Risk (04/19/2024)     Readmission Risk Interventions    04/22/2024    9:52 AM  Readmission Risk Prevention Plan  Post Dischage Appt Complete  Medication Screening Complete  Transportation Screening Complete

## 2024-04-22 NOTE — Plan of Care (Signed)

## 2024-04-22 NOTE — Telephone Encounter (Signed)
 Patient Product/process development scientist completed.    The patient is insured through [facility]. Patient has [employer], may use a copay card, and/or apply for patient assistance if available.    Ran test claim for Eliquis 5 mg and the current 30 day co-pay is $176.39 due to a deductible.   This test claim was processed through [facility]- copay amounts may vary at other pharmacies due to pharmacy/plan contracts, or as the patient moves through the different stages of their insurance plan.     Reyes Sharps, CPHT Pharmacy Technician III Certified Patient Advocate [facility] Patient Advocate Team Direct Number: [phone]  Fax: [phone]

## 2024-04-22 NOTE — Care Management Important Message (Signed)
 Important Message  Patient Details  Name: Nicholas Hamilton MRN: [id number] Date of Birth: 1946-01-17   Important Message Given:  Yes - Medicare IM     Vonzell Arrie Sharps 04/22/2024, 10:33 AM

## 2024-04-22 NOTE — Progress Notes (Signed)
 Heart Failure Navigator Progress Note  Assessed for Heart & Vascular TOC clinic readiness.  Patient does not meet criteria due to has a scheduled [facility] appointment on 05/13/2024, No HF TOC per Dr. Noralee. .   Navigator will sign off at this time.   Stephane Haddock, BSN, Scientist, clinical (histocompatibility and immunogenetics) Only

## 2024-04-22 NOTE — Assessment & Plan Note (Addendum)
 Paroxysmal atrial fibrillation.   Patient was noted to have paroxysmal atrial fibrillation on telemetry, 12 lead EKG with atrial flutter with variable block. Rate is controlled in the 80 bpm   Cha2ds2vasc score is 4  Plan to continue carvedilol  and added apixaban. Follow up as outpatient.  Note that he had sinus bradycardia in the past, and on admission had first degree AV block.

## 2024-04-22 NOTE — Discharge Summary (Addendum)
 Physician Discharge Summary   Patient: Nicholas Hamilton MRN: [id number] DOB: 18-Jan-1946  Admit date:     04/19/2024  Discharge date: 04/22/24  Discharge Physician: Elidia Sieving Raeghan Demeter   PCP: Yolande Sieving MATSU, MD   Recommendations at discharge:    Patient was placed on losartan for blood pressure control, hold on lisinopril and amlodipine to avoid hypotension. Continue carvedilol .  Added apixaban for atrial fibrillation/ flutter.  Diuresis with furosemide  40 mg po daily Follow up renal function and electrolytes in 7 days as outpatient Follow up with Dr Yolande in 7 to 10 days Follow up with Cardiology as scheduled.    Discharge Diagnoses: Principal Problem:   Acute on chronic diastolic CHF (congestive heart failure) (HCC) Active Problems:   CAD S/P PCI to RCA - 2001; Cx 2003   Chronic kidney disease, stage 3b (HCC)   Essential hypertension   Hypothyroidism   Type 2 diabetes mellitus with hyperlipidemia (HCC)   Obesity, class 1  Resolved Problems:   * No resolved hospital problems. [facility] Course: Mr. Nicholas Hamilton was admitted to the hospital with the working diagnosis of heart failure exacerbation.   78 yo male with the past medical history of coronary artery disease, hypertension, hypothyroidism, T2DM, and CKD who presented with dyspnea. Reported 3 weeks of worsening lower extremity edema and dyspnea. Positive weight gain 3 lbs. On 06/24 he was evaluated at the outpatient cardiology office and was found volume overloaded, he got prescription for 20 mg furosemide  to take daily. Unfortunately his symptoms continue to worsen and on the day of admission he had severe dyspnea. EMS was called and he was found in respiratory distress and hypoxemic with 02 saturation 80% on room air. He was placed on Cpap and was transported to the ED.  In the ED he was placed on Bipap, 40% fi02, his blood pressure was 100/58, HR 56, RR 37 to 39 and 02 saturation 91% Lungs with no wheezing or  rhonchi, heart with S1 and S2 present and regular with no gallops, rubs or murmurs, abdomen with no distention and positive lower extremity edema.   Na 138, K 4,0 Cl 107 bicarbonate 22 glucose 220, bun 21 cr 1,95  Mg 1,7  BNP 663.5  High sensitive troponin 15 and 34  Wbc 9.9 hgb 11.2 plt 194   Chest radiograph with cardiomegaly, bilateral hilar vascular congestion, with bilateral central interstitial infiltrates, no effusions.   EKG 81 bpm, right axis deviation, qtc 450, normal intervals, sinus rhythm with 1st degree AV block, no significant ST segment or T wave changes. Poor RR wave progression.   Patient was placed on furosemide  for diuresis. He developed hypotension, requiring vasopressor therapy.   06/27 Weaned off vasopressors  06/28 transfer to [facility]  06/29 blood pressure has been stable and he has responded well to diuresis. Resuming heart failure therapy   Assessment and Plan: * Acute on chronic diastolic CHF (congestive heart failure) (HCC) Echocardiogram with mildly reduced LV systolic function with EF 40 to 50%, global hypokinesis, moderate dilatation of LV cavity, mild LVH, RV systolic function preserved, LA with mild dilatation, mild to moderate mitral valve regurgitation,    Patient was placed on IV furosemide  for diuresis, he required vasopressor therapy for brief period of time to maintain hemodynamic stability.  At the time of discharge his volume status is negative -4.930 ml  Documented weight lost about 11 kg since admission per documented weight.  Systolic blood pressure 150 mmHg.   Continue SGLT 2 inh,  carvedilol  and losartan Holding on mineralocorticoid receptor blocker due to reduced GFR.  Continue furosemide  40 mg po daily to keep negative fluid balance.    Acute hypoxemic respiratory failure due to acute cardiogenic pulmonary edema.  Weaned off Bipap.  His 02 saturation today is 96% on room air.  Acute metabolic encephalopathy has been ruled out.   CAD S/P  PCI to RCA - 2001; Cx 2003 No chest pain, no acute coronary syndrome  Elevation in high sensitive troponin due to heart failure exacerbation.  Old records personally reviewed: patient had BMS to the RCA in 2001 and DES x2 to the left circumflex in 2003.  Will continue aspirin and statin.  Follow up as outpatient.   Paroxysmal atrial flutter (HCC) Paroxysmal atrial fibrillation.   Patient was noted to have paroxysmal atrial fibrillation on telemetry, 12 lead EKG with atrial flutter with variable block. Rate is controlled in the 80 bpm   Cha2ds2vasc score is 4  Plan to continue carvedilol  and added apixaban. Follow up as outpatient.  Note that he had sinus bradycardia in the past, and on admission had first degree AV block.   Essential hypertension Blood pressure has been stable, continue carvedilol  and losartan.   Hypothyroidism Continue levothyroxine   Chronic kidney disease, stage 3b (HCC) Basal serum cr around 2.0   At the time of discharge hs serum cr is 2,0 with K at 3,7 and serum bicarbonate at 23 Na 136 and Mg 1,8   Patient had IV mag and will get one dose of PO KCl 40 meq prior to his discharge  Follow up renal function and electrolytes as outpatient   Type 2 diabetes mellitus with hyperlipidemia (HCC) His glucose remained stable off insulin therapy   A the time of discharge he will resume metformin and glimepiride with close follow up as outpatient on capillary glucose.  Continue with statin and fenofibrate therapy   Obesity, class 1 Calculated BMI is 31.2        Consultants: critical care  Procedures performed: none   Disposition: Home Diet recommendation:  Cardiac diet DISCHARGE MEDICATION: Allergies as of 04/22/2024       Reactions   Penicillins Other (See Comments)   Diaphoresis         Medication List     STOP taking these medications    amLODipine 2.5 MG tablet Commonly known as: NORVASC   lisinopril 40 MG tablet Commonly known as:  ZESTRIL       TAKE these medications    acetaminophen 500 MG tablet Commonly known as: TYLENOL Take 500 mg by mouth every 6 (six) hours as needed for moderate pain (pain score 4-6) or headache.   apixaban 5 MG Tabs tablet Commonly known as: ELIQUIS Take 1 tablet (5 mg total) by mouth 2 (two) times daily.   aspirin EC 81 MG tablet Take 81 mg by mouth every evening.   carvedilol  6.25 MG tablet Commonly known as: COREG  Take 1 tablet (6.25 mg total) by mouth 2 (two) times daily with a meal.   fenofibrate micronized 200 MG capsule Commonly known as: LOFIBRA Take 200 mg by mouth daily.   FISH OIL PO Take 1 capsule by mouth daily.   furosemide  40 MG tablet Commonly known as: LASIX  Take 1 tablet (40 mg total) by mouth daily. Start taking on: April 23, 2024 What changed:  medication strength how much to take   gabapentin  300 MG capsule Commonly known as: NEURONTIN  Take 300 mg by mouth 2 (two)  times daily.   glimepiride 1 MG tablet Commonly known as: AMARYL Take 1 mg by mouth daily.   Jardiance 25 MG Tabs tablet Generic drug: empagliflozin Take 25 mg by mouth daily.   levothyroxine 150 MCG tablet Commonly known as: SYNTHROID Take 150 mcg by mouth See admin instructions. Take 1 tablet (150mcg) by mouth in the morning, Monday-Saturday only (skip Sunday).   losartan 25 MG tablet Commonly known as: COZAAR Take 1 tablet (25 mg total) by mouth daily. Start taking on: April 23, 2024   metFORMIN 1000 MG tablet Commonly known as: GLUCOPHAGE Take 500-1,000 mg by mouth 2 (two) times daily with a meal. Take 1/2 tablet (500mg ) by mouth with breakfast and take 1 tablet (1000mg ) with supper   nitroGLYCERIN  0.4 MG SL tablet Commonly known as: NITROSTAT  Place 1 tablet (0.4 mg total) under the tongue every 5 (five) minutes as needed for chest pain.   omeprazole 20 MG capsule Commonly known as: PRILOSEC Take 20 mg by mouth daily.   rosuvastatin  40 MG tablet Commonly known as:  CRESTOR  TAKE ONE BY MOUTH MONDAY , WEDNESDAY , FRIDAY AND 1/2 TABLET BY MOUTH ALL THE OTHR DAYS AS DIRECTED   VITAMIN B12 PO Take 1 tablet by mouth daily.   Vitamin D (Ergocalciferol) 1.25 MG (50000 UNIT) Caps capsule Commonly known as: DRISDOL Take 50,000 Units by mouth every 7 (seven) days.        Follow-up Information     Yolande Toribio MATSU, MD Follow up.   Specialty: Internal Medicine Contact information: [address] [place] [address] [phone]                Discharge Exam: Fredricka Weights   04/19/24 0045 04/21/24 0433  Weight: 96 kg 85.3 kg   BP (!) 128/53 (BP Location: Left Arm)   Pulse 63   Temp 97.9 F (36.6 C) (Oral)   Resp 18   Ht 5' 9 (1.753 m)   Wt 85.3 kg   SpO2 96%   BMI 27.76 kg/m   Patient is feeling better, no chest pain or dyspnea, no palpitations, no PND or orthopnea  Neurology awake and alert ENT with mild pallor  Cardiovascular with S1 and S2 present and irregular with no gallops, or rubs, positive murmur a the apex.  Respiratory with no rales or wheezing, no rhonchi  Abdomen with no distention  No lower extremity edema   Condition at discharge: stable  The results of significant diagnostics from this hospitalization (including imaging, microbiology, ancillary and laboratory) are listed below for reference.   Imaging Studies: VAS US  LOWER EXTREMITY VENOUS (DVT) Result Date: 04/19/2024  Lower Venous DVT Study Patient Name:  LAURI TILL [facility]  Date of Exam:   04/19/2024 Medical Rec #: [id number]           Accession #:    [id number] Date of Birth: [birth year]           Patient Gender: M Patient Age:   55 years Exam Location:  [facility] Procedure:      VAS US  LOWER EXTREMITY VENOUS (DVT) Referring Phys: JESSICA MARSHALL --------------------------------------------------------------------------------  Indications: Pain, and Swelling.  Performing Technologist: Elmarie Crump,RVT  Examination Guidelines: A complete  evaluation includes B-mode imaging, spectral Doppler, color Doppler, and power Doppler as needed of all accessible portions of each vessel. Bilateral testing is considered an integral part of a complete examination. Limited examinations for reoccurring indications may be performed as noted. The reflux portion of the exam is performed  with the patient in reverse Trendelenburg.  +---------+---------------+---------+-----------+----------+-------------------+ RIGHT    CompressibilityPhasicitySpontaneityPropertiesThrombus Aging      +---------+---------------+---------+-----------+----------+-------------------+ CFV      Full           Yes      Yes                                      +---------+---------------+---------+-----------+----------+-------------------+ SFJ      Full                                                             +---------+---------------+---------+-----------+----------+-------------------+ FV Prox  Full                                                             +---------+---------------+---------+-----------+----------+-------------------+ FV Mid   Full                                                             +---------+---------------+---------+-----------+----------+-------------------+ FV DistalFull                                                             +---------+---------------+---------+-----------+----------+-------------------+ PFV      Full                                                             +---------+---------------+---------+-----------+----------+-------------------+ POP      Full           Yes      Yes                                      +---------+---------------+---------+-----------+----------+-------------------+ PTV      Full                                                             +---------+---------------+---------+-----------+----------+-------------------+ PERO                                                   Not well visualized +---------+---------------+---------+-----------+----------+-------------------+ THe right peroneal  veins are not seen well.  +---------+---------------+---------+-----------+----------+--------------+ LEFT     CompressibilityPhasicitySpontaneityPropertiesThrombus Aging +---------+---------------+---------+-----------+----------+--------------+ CFV      Full           Yes      Yes                                 +---------+---------------+---------+-----------+----------+--------------+ SFJ      Full                                                        +---------+---------------+---------+-----------+----------+--------------+ FV Prox  Full                                                        +---------+---------------+---------+-----------+----------+--------------+ FV Mid   Full                                                        +---------+---------------+---------+-----------+----------+--------------+ FV DistalFull                                                        +---------+---------------+---------+-----------+----------+--------------+ PFV      Full                                                        +---------+---------------+---------+-----------+----------+--------------+ POP      Full           Yes      Yes                                 +---------+---------------+---------+-----------+----------+--------------+ PTV      Full                                                        +---------+---------------+---------+-----------+----------+--------------+ PERO     Full                                                        +---------+---------------+---------+-----------+----------+--------------+     Summary: RIGHT: - There is no evidence of deep vein thrombosis in the lower extremity. However, portions of this examination were limited- see technologist comments above.   - No cystic structure found in the popliteal fossa.  LEFT: - There is no evidence of deep vein thrombosis in the lower extremity.  - No cystic structure found in the popliteal fossa.  *See table(s) above for measurements and observations. Electronically signed by Norman Serve on 04/19/2024 at 4:10:53 PM.    Final    ECHOCARDIOGRAM COMPLETE Result Date: 04/19/2024    ECHOCARDIOGRAM REPORT   Patient Name:   Nicholas Hamilton [facility] Date of Exam: 04/19/2024 Medical Rec #:  [id number]          Height:       69.0 in Accession #:    [id number]         Weight:       211.6 lb Date of Birth:  Feb 21, 1946          BSA:          2.116 m Patient Age:    78 years           BP:           130/59 mmHg Patient Gender: M                  HR:           66 bpm. Exam Location:  Inpatient Procedure: 2D Echo, Cardiac Doppler, Color Doppler and Intracardiac            Opacification Agent (Both Spectral and Color Flow Doppler were            utilized during procedure). Indications:    I50.31 Acute diastolic (congestive) heart failure  History:        Patient has no prior history of Echocardiogram examinations.                 CHF, CAD; Risk Factors:Hypertension, Diabetes and Dyslipidemia.  Sonographer:    Damien Senior RDCS Referring Phys: [id number] RAHUL P DESAI IMPRESSIONS  1. LVEF difficult to quantify due to beat to beat variability but likely at least mildly reduced. Left ventricular ejection fraction, by estimation, is 40 to 50%. The left ventricle has mildly decreased function. The left ventricle demonstrates global hypokinesis. The left ventricular internal cavity size was moderately dilated. There is mild concentric left ventricular hypertrophy. Left ventricular diastolic parameters are indeterminate.  2. Right ventricular systolic function is normal. The right ventricular size is normal. Tricuspid regurgitation signal is inadequate for assessing PA pressure.  3. Left atrial size was mildly dilated.  4. The mitral valve is degenerative.  Mild to moderate mitral valve regurgitation.  5. The aortic valve is tricuspid. There is mild calcification of the aortic valve. Aortic valve regurgitation is mild. Aortic valve sclerosis/calcification is present, without any evidence of aortic stenosis.  6. The inferior vena cava is dilated in size with <50% respiratory variability, suggesting right atrial pressure of 15 mmHg. FINDINGS  Left Ventricle: LVEF difficult to quantify due to beat to beat variability but likely at least mildly reduced. Left ventricular ejection fraction, by estimation, is 40 to 50%. The left ventricle has mildly decreased function. The left ventricle demonstrates global hypokinesis. Definity contrast agent was given IV to delineate the left ventricular endocardial borders. The left ventricular internal cavity size was moderately dilated. There is mild concentric left ventricular hypertrophy. Left ventricular diastolic parameters are indeterminate. Right Ventricle: The right ventricular size is normal. No increase in right ventricular wall thickness. Right ventricular systolic function is normal. Tricuspid regurgitation signal is inadequate for assessing PA pressure. Left Atrium: Left atrial size was mildly dilated. Right Atrium: Right atrial size  was normal in size. Pericardium: Trivial pericardial effusion is present. Mitral Valve: The mitral valve is degenerative in appearance. Mild to moderate mitral valve regurgitation. Tricuspid Valve: The tricuspid valve is normal in structure. Tricuspid valve regurgitation is not demonstrated. Aortic Valve: The aortic valve is tricuspid. There is mild calcification of the aortic valve. Aortic valve regurgitation is mild. Aortic valve sclerosis/calcification is present, without any evidence of aortic stenosis. Pulmonic Valve: The pulmonic valve was normal in structure. Pulmonic valve regurgitation is trivial. Aorta: The aortic root and ascending aorta are structurally normal, with no evidence of  dilitation. Venous: The inferior vena cava is dilated in size with less than 50% respiratory variability, suggesting right atrial pressure of 15 mmHg. IAS/Shunts: No atrial level shunt detected by color flow Doppler.  LEFT VENTRICLE PLAX 2D LVIDd:         5.10 cm LVIDs:         3.50 cm LV PW:         1.10 cm LV IVS:        1.00 cm LVOT diam:     2.20 cm LV SV:         79 LV SV Index:   37 LVOT Area:     3.80 cm  LV Volumes (MOD) LV vol d, MOD A2C: 182.0 ml LV vol d, MOD A4C: 190.0 ml LV vol s, MOD A2C: 113.0 ml LV vol s, MOD A4C: 97.1 ml LV SV MOD A2C:     69.0 ml LV SV MOD A4C:     190.0 ml LV SV MOD BP:      80.6 ml RIGHT VENTRICLE RV S prime:     9.79 cm/s TAPSE (M-mode): 1.8 cm LEFT ATRIUM             Index        RIGHT ATRIUM           Index LA diam:        3.70 cm 1.75 cm/m   RA Area:     18.00 cm LA Vol (A2C):   90.4 ml 42.72 ml/m  RA Volume:   48.80 ml  23.06 ml/m LA Vol (A4C):   59.0 ml 27.88 ml/m LA Biplane Vol: 72.9 ml 34.45 ml/m  AORTIC VALVE LVOT Vmax:   74.20 cm/s LVOT Vmean:  55.900 cm/s LVOT VTI:    0.207 m  AORTA Ao Root diam: 3.20 cm Ao Asc diam:  3.20 cm MITRAL VALVE MV Area (PHT): 3.36 cm     SHUNTS MV Decel Time: 226 msec     Systemic VTI:  0.21 m MV E velocity: 106.00 cm/s  Systemic Diam: 2.20 cm MV A velocity: 44.50 cm/s MV E/A ratio:  2.38 Morene Brownie Electronically signed by Morene Brownie Signature Date/Time: 04/19/2024/10:05:32 AM    Final    DG Chest Portable 1 View Result Date: 04/19/2024 CLINICAL DATA:  Dyspnea EXAM: PORTABLE CHEST 1 VIEW COMPARISON:  09/20/2015 FINDINGS: Moderate perihilar interstitial pulmonary edema, new since prior examination. No pneumothorax or pleural effusion. Cardiac size is within normal limits. No acute bone abnormality. IMPRESSION: 1. Moderate perihilar interstitial pulmonary edema. Electronically Signed   By: Dorethia Molt M.D.   On: 04/19/2024 02:16    Microbiology: Results for orders placed or performed during the hospital encounter of  04/19/24  MRSA Next Gen by PCR, Nasal     Status: None   Collection Time: 04/19/24  7:53 AM   Specimen: Nasal Mucosa; Nasal Swab  Result Value Ref  Range Status   MRSA by PCR Next Gen NOT DETECTED NOT DETECTED Final    Comment: (NOTE) The GeneXpert MRSA Assay (FDA approved for NASAL specimens only), is one component of a comprehensive MRSA colonization surveillance program. It is not intended to diagnose MRSA infection nor to guide or monitor treatment for MRSA infections. Test performance is not FDA approved in patients less than 60 years old. Performed at [facility], [address] [place], [place] [address]     Labs: CBC: Recent Labs  Lab 04/16/24 1519 04/19/24 0049 04/19/24 0235 04/19/24 0414 04/19/24 0447  WBC 5.5 9.9  --  8.1  --   HGB 10.4* 11.2* 11.9* 10.5* 10.9*  HCT 33.1* 35.3* 35.0* 33.7* 32.0*  MCV 92 92.2  --  92.8  --   PLT 173 194  --  168  --    Basic Metabolic Panel: Recent Labs  Lab 04/16/24 1519 04/16/24 1519 04/19/24 0049 04/19/24 0235 04/19/24 0414 04/19/24 0447 04/20/24 1110 04/21/24 0328 04/22/24 0246  NA 140   < > 138 139  --  142 139 140 136  K 4.3  --  4.0 5.0  --  4.1 3.9 3.6 3.7  CL 106  --  107 108  --   --  106 105 103  CO2 20  --  22  --   --   --  23 26 23   GLUCOSE 166*  --  220* 212*  --   --  115* 70 98  BUN 20  --  21 32*  --   --  25* 21 23  CREATININE 1.94*  --  1.95* 2.00* 2.17*  --  1.84* 1.86* 2.04*  CALCIUM  10.3*  --  9.8  --   --   --  10.2 10.4* 10.1  MG  --   --  1.7  --   --   --   --  1.8  --    < > = values in this interval not displayed.   Liver Function Tests: Recent Labs  Lab 04/19/24 0414  AST 19  ALT 10  ALKPHOS 20*  BILITOT 0.7  PROT 6.7  ALBUMIN 3.1*   CBG: Recent Labs  Lab 04/20/24 0625 04/20/24 1247 04/20/24 1527 04/21/24 0606 04/21/24 1545  GLUCAP 114* 163* 163* 90 156*    Discharge time spent: greater than 30 minutes.  Signed: Elidia Toribio Furnace, MD [facility] 04/22/2024

## 2024-04-22 NOTE — Discharge Instructions (Signed)

## 2024-04-24 ENCOUNTER — Other Ambulatory Visit

## 2024-04-29 ENCOUNTER — Telehealth (HOSPITAL_COMMUNITY): Payer: Self-pay | Admitting: *Deleted

## 2024-04-29 ENCOUNTER — Telehealth: Payer: Self-pay | Admitting: Medical

## 2024-04-29 NOTE — Telephone Encounter (Signed)
 Attempted to call patient regarding upcoming cardiac PET appointment. Left message on voicemail with name and callback number Johney Frame RN Navigator Cardiac Imaging [facility] [phone] Office

## 2024-04-29 NOTE — Telephone Encounter (Signed)
 New Message:     Patient's wife says patient would like to have his CT  Pet Scan scheduled at [facility] before 05-23-24 if possible please.

## 2024-04-29 NOTE — Telephone Encounter (Signed)
 Called patient to tell him what our PET CT scheduler had said, he said his wife was on the other line at the same time with the scheduler because they had a cancellation come up. Nothing else needed at this time.

## 2024-04-29 NOTE — Telephone Encounter (Signed)
 Reaching out to patient to offer assistance regarding upcoming cardiac imaging study; pt verbalizes understanding of appt date/time, parking situation and where to check in, pre-test NPO status and medications ordered, and verified current allergies; name and call back number provided for further questions should they arise Sid Seats RN Navigator Cardiac Imaging [facility] [phone] office [phone] cell  Wife aware to avoid caffeine for 12 hours prior to test.

## 2024-04-30 ENCOUNTER — Ambulatory Visit (HOSPITAL_COMMUNITY)
Admission: RE | Admit: 2024-04-30 | Discharge: 2024-04-30 | Disposition: A | Discharge status: Home / Self Care | Source: Ambulatory Visit | Attending: Medical | Admitting: Medical

## 2024-04-30 DIAGNOSIS — R0602 Shortness of breath: Secondary | ICD-10-CM | POA: Diagnosis present

## 2024-04-30 DIAGNOSIS — I509 Heart failure, unspecified: Secondary | ICD-10-CM | POA: Diagnosis present

## 2024-04-30 LAB — NM PET CT CARDIAC PERFUSION MULTI W/ABSOLUTE BLOODFLOW
MBFR: 1.19
Rest MBF: 0.59 ml/g/min
Rest Nuclear Isotope Dose: 21.4 mCi
ST Depression (mm): 0 mm
Stress MBF: 0.7 ml/g/min
Stress Nuclear Isotope Dose: 21.6 mCi
TID: 1.07

## 2024-04-30 MED ORDER — RUBIDIUM RB82 GENERATOR (RUBYFILL)
22.5600 | PACK | Freq: Once | INTRAVENOUS | Status: AC
Start: 2024-04-30 — End: 2024-04-30
  Administered 2024-04-30: 21.63 via INTRAVENOUS

## 2024-04-30 MED ORDER — REGADENOSON 0.4 MG/5ML IV SOLN
0.4000 mg | Freq: Once | INTRAVENOUS | Status: AC
  Administered 2024-04-30: 0.4 mg via INTRAVENOUS

## 2024-04-30 MED ORDER — RUBIDIUM RB82 GENERATOR (RUBYFILL)
22.5600 | PACK | Freq: Once | INTRAVENOUS | Status: AC
Start: 2024-04-30 — End: 2024-04-30
  Administered 2024-04-30: 21.37 via INTRAVENOUS

## 2024-04-30 MED ORDER — REGADENOSON 0.4 MG/5ML IV SOLN
INTRAVENOUS | Status: AC
  Filled 2024-04-30: qty 5

## 2024-04-30 NOTE — Progress Notes (Signed)
 Pt. Tolerated lexi scan well.

## 2024-05-02 ENCOUNTER — Telehealth: Payer: Self-pay | Admitting: Cardiology

## 2024-05-02 NOTE — Telephone Encounter (Signed)
 Returned call to patient's wife.Appointment scheduled with Dr.Hochrein 7/17 at 3:20 pm to discuss cardiac pet scan results.

## 2024-05-02 NOTE — Telephone Encounter (Signed)
 Wife was returning call. Please advise ?

## 2024-05-08 NOTE — Progress Notes (Unsigned)
 Cardiology Office Note:   Date:  05/09/2024  ID:  Nicholas Hamilton, DOB [birth year], MRN [id number] PCP: Nicholas Toribio MATSU, MD  [facility] Providers Cardiologist:  None {  History of Present Illness:   Nicholas Hamilton is a 78 y.o. male history of coronary artery disease as described below.  His last cardiac cath in 2006 demonstrated mild left ventricular dysfunction with an EF 50%.  Had widely patent stents with high-grade stenosis and small posterolateral that was managed medically.  He had increased shortness of breath and lower extremity swelling this spring.  He had obvious volume overload.  An ultrasound and stress test were ordered but before he could present with those he presented to the hospital.  He was admitted with acute on chronic heart failure.  He was noted to have an EF of 40 to 50%.  There was global hypokinesis.  He required diuresis.  He actually had hypotension and needed to be treated with vasopressors.  He was found to have atrial fibrillation was sent home on Eliquis .  He had CKD and not clear of his baseline creatinine but he was not aware of this prior to this presentation.  When he was sent home he was sent home off of lisinopril and amlodipine.  He was sent home on Eliquis  and aspirin .  He was on a low-dose of carvedilol  because he had some bradycardia.  He was treated with diuretics Jardiance  and losartan .  He had a stress test July 9.  There was no ischemia no infarction.  However, there was suboptimal imaging because of ventricular bigeminy.  Global myocardial blood flow reserve was thought to be abnormal and test was listed as high risk.  He presents for follow-up.  He feels better.  He is less short of breath.  He does have some lower extremity swelling but this is improved.  He is not having any new PND or orthopnea.  He is not having any chest pressure.  He is not having any palpitations.  He still is getting dyspneic with exertion walking a mile  distance on level ground in his house.  ROS: As stated in the HPI and negative for all other systems.  Studies Reviewed:    EKG:   EKG Interpretation Date/Time:  Thursday May 09 2024 15:20:30 EDT Ventricular Rate:  56 PR Interval:  272 QRS Duration:  98 QT Interval:  438 QTC Calculation: 422 R Axis:   25  Text Interpretation: Sinus bradycardia with 1st degree A-V block Poor anterior R wave progression When compared with ECG of 22-Apr-2024 12:29, Sinus rhythm has replaced atrial fib Non-specific change in ST segment in Anterior leads T wave inversion no longer evident in Anterior leads QT has shortened Confirmed by Lavona Agent ([id number]) on 05/09/2024 4:00:35 PM   Risk Assessment/Calculations:    CHA2DS2-VASc Score = 8   This indicates a 10.8% annual risk of stroke. The patient's score is based upon: CHF History: 1 HTN History: 1 Diabetes History: 1 Stroke History: 2 Vascular Disease History: 1 Age Score: 2 Gender Score: 0   Physical Exam:   VS:  BP (!) 114/48 (BP Location: Left Arm, Patient Position: Sitting, Cuff Size: Normal)   Pulse (!) 56   Resp 16   Ht 5' 9 (1.753 m)   Wt 195 lb 3.2 oz (88.5 kg)   SpO2 97%   BMI 28.83 kg/m    Wt Readings from Last 3 Encounters:  05/09/24 195 lb 3.2 oz (88.5 kg)  04/21/24  188 lb (85.3 kg)  04/16/24 211 lb 3.2 oz (95.8 kg)     GEN: Well nourished, well developed in no acute distress NECK: No JVD; No carotid bruits CARDIAC: RRR, no murmurs, rubs, gallops RESPIRATORY:  Clear to auscultation without rales, wheezing or rhonchi  ABDOMEN: Soft, non-tender, non-distended EXTREMITIES:  Moderate leg edema; No deformity   ASSESSMENT AND PLAN:   Coronary artery disease: He is a previous bare-metal stenting to an RCA in 2001.  He had DES x 2 to the circumflex in 2003.  Stress test did not suggest ischemia or infarct but there was an abnormal blood flow reserve.  With that in the PVCs it was thought to be high risk.  He needs right and  left heart cath.  I had this conversation with him and he understands the risks benefits. The patient understands that risks included but are not limited to stroke (1 in 1000), death (1 in 1000), kidney failure [usually temporary] (1 in 500), bleeding (1 in 200), allergic reaction [possibly serious] (1 in 200).  The patient understands and agrees to proceed.  He is scheduled for Dr. Swaziland to do his case despite his high CHA2DS2-VASc score we can hold 4 doses of his Eliquis .  I am going to have his stop his losartan  and his Lasix  2 days prior.  I do not want to admit him for hydration because of his recent volume overload.  He is going to hold his Jardiance  and metformin.  We can use as little dye as possible.  Dyslipidemia: LDL was most recently 66.  HDL is 48.  No change in therapy.  HTN: His blood pressure is controlled.  No change in therapy.    PAF: He can resume his Eliquis  after his catheterization.  Follow up with me after that procedure.  Signed, Lynwood Schilling, MD

## 2024-05-09 ENCOUNTER — Ambulatory Visit: Attending: Cardiology | Admitting: Cardiology

## 2024-05-09 ENCOUNTER — Encounter: Payer: Self-pay | Admitting: Cardiology

## 2024-05-09 VITALS — BP 114/48 | HR 56 | Resp 16 | Ht 69.0 in | Wt 195.2 lb

## 2024-05-09 DIAGNOSIS — Z9861 Coronary angioplasty status: Secondary | ICD-10-CM | POA: Diagnosis not present

## 2024-05-09 DIAGNOSIS — Z01812 Encounter for preprocedural laboratory examination: Secondary | ICD-10-CM | POA: Diagnosis not present

## 2024-05-09 DIAGNOSIS — R0602 Shortness of breath: Secondary | ICD-10-CM | POA: Diagnosis not present

## 2024-05-09 DIAGNOSIS — I2510 Atherosclerotic heart disease of native coronary artery without angina pectoris: Secondary | ICD-10-CM | POA: Diagnosis not present

## 2024-05-09 NOTE — Patient Instructions (Addendum)
 Medication Instructions:  Your physician recommends that you continue on your current medications as directed. Please refer to the Current Medication list given to you today.  *If you need a refill on your cardiac medications before your next appointment, please call your pharmacy*  Follow-Up: At [facility], you and your health needs are our priority.  As part of our continuing mission to provide you with exceptional heart care, our providers are all part of one team.  This team includes your primary Cardiologist (physician) and Advanced Practice Providers or APPs (Physician Assistants and Nurse Practitioners) who all work together to provide you with the care you need, when you need it.  Your next appointment:   3-4 week(s)  Provider:   One of our Advanced Practice Providers (APPs): Morse Clause, PA-C  Lamarr Satterfield, NP Miriam Shams, NP  Olivia Pavy, PA-C Josefa Beauvais, NP  Leontine Salen, PA-C Orren Fabry, PA-C  [place], PA-C Ernest Dick, NP  Damien Braver, NP Jon Hails, PA-C  Waddell Donath, PA-C    Dayna Dunn, PA-C  Scott Weaver, PA-C Lum Louis, NP Katlyn West, NP Callie Goodrich, PA-C  Evan Williams, PA-C Sheng Haley, PA-C  Xika Zhao, NP Kathleen Briahnna Harries, PA-C   We recommend signing up for the patient portal called MyChart.  Sign up information is provided on this After Visit Summary.  MyChart is used to connect with patients for Virtual Visits (Telemedicine).  Patients are able to view lab/test results, encounter notes, upcoming appointments, etc.  Non-urgent messages can be sent to your provider as well.   To learn more about what you can do with MyChart, go to [email or web address].   Other Instructions     Cardiac/Peripheral Catheterization   You are scheduled for a Cardiac Catheterization on Wednesday, July 23 with Dr. Peter Swaziland.  1. Please arrive at the [facility] (Main Entrance A) at [facility]: [address]  [place], [place] [address] at 9:30 AM (This time is 2 hour(s) before your procedure to ensure your preparation).   Free valet parking service is available. You will check in at ADMITTING. The support person will be asked to wait in the waiting room.  It is OK to have someone drop you off and come back when you are ready to be discharged.        Special note: Every effort is made to have your procedure done on time. Please understand that emergencies sometimes delay scheduled procedures.  2. Diet: Do not eat solid foods after midnight.  You may have clear liquids until 5 AM the day of the procedure.  3. Labs: Already completed.  4. Medication instructions in preparation for your procedure:   Contrast Allergy: No  Stop taking Eliquis  (Apixiban) on Monday, July 21.  Stop taking, Lasix  (Furosemide )  Wednesday, July 23,  Stop taking Jardiance  on Sunday, July 20  Do not take your metformin the morning of your procedure on Wednesday, July 23rd and continue to hold for 2 days after your procedure. You will restart this on Saturday July 26th.  On the morning of your procedure, take Aspirin  81 mg and any morning medicines NOT listed above.  You may use sips of water.  5. Plan to go home the same day, you will only stay overnight if medically necessary. 6. You MUST have a responsible adult to drive you home. 7. An adult MUST be with you the first 24 hours after you arrive home. 8. Bring a current list of your medications,  and the last time and date medication taken. 9. Bring ID and current insurance cards. 10.Please wear clothes that are easy to get on and off and wear slip-on shoes.  Thank you for allowing us  to care for you!   -- [facility]

## 2024-05-10 ENCOUNTER — Other Ambulatory Visit: Payer: Self-pay | Admitting: Cardiology

## 2024-05-10 LAB — CBC

## 2024-05-10 MED ORDER — FUROSEMIDE 40 MG PO TABS: 40.0000 mg | ORAL_TABLET | Freq: Every day | ORAL | 0 refills | Status: DC

## 2024-05-10 MED ORDER — APIXABAN 5 MG PO TABS: 5.0000 mg | ORAL_TABLET | Freq: Two times a day (BID) | ORAL | 1 refills | Status: DC

## 2024-05-10 MED ORDER — LOSARTAN POTASSIUM 25 MG PO TABS: 25.0000 mg | ORAL_TABLET | Freq: Every day | ORAL | 0 refills | Status: DC

## 2024-05-10 NOTE — Telephone Encounter (Signed)
 Pt is requesting a refill on medications eliquis , furosemide  and losartan . These medications were prescribed in the hospital. Would Dr. Lavona like to refill these medications? Please address

## 2024-05-10 NOTE — Telephone Encounter (Signed)
 Medication refilled . Patient is currently on medication( losartan  , lasix )  at appt 05/09/24 with Dr Lavona.

## 2024-05-10 NOTE — Telephone Encounter (Signed)
 Eliquis  5mg  refill request received. Patient is 78 years old, weight-88.5kg, Crea-2.04 on 04/22/24, Diagnosis-Afib/flutter at 04/19/24 hospitalization, and last seen by Dr. Lavona on 05/09/24 (will hold for upcoming heart cath per Dr. Lavona HARPER note on 05/09/24). Dose is appropriate based on dosing criteria. Will send in refill to requested pharmacy.

## 2024-05-10 NOTE — Telephone Encounter (Signed)
*  STAT* If patient is at the pharmacy, call can be transferred to refill team.   1. Which medications need to be refilled? (please list name of each medication and dose if known) new prescription for Eliquis ,  Furosemide  40 mg , and Losartan    2. Would you like to learn more about the convenience, safety, & potential cost savings by using the [facility]?      3. Are you open to using the [facility] (Type [facility].   4. Which pharmacy/location (including street and city if local pharmacy) is medication to be sent to?[facility], [place]   5. Do they need a 30 day or 90 day supply? 90 days and refills

## 2024-05-11 ENCOUNTER — Ambulatory Visit: Payer: Self-pay | Admitting: Cardiology

## 2024-05-11 LAB — CBC
Hematocrit: 36.1 — AB (ref 37.5–51.0)
Hemoglobin: 11.2 g/dL — AB (ref 13.0–17.7)
MCH: 28.7 pg (ref 26.6–33.0)
MCHC: 31 g/dL — AB (ref 31.5–35.7)
MCV: 93 fL (ref 79–97)
Platelets: 158 x10E3/uL (ref 150–450)
RBC: 3.9 x10E6/uL — AB (ref 4.14–5.80)
RDW: 13.4 (ref 11.6–15.4)
WBC: 5.4 x10E3/uL (ref 3.4–10.8)

## 2024-05-11 LAB — BASIC METABOLIC PANEL WITH GFR
BUN/Creatinine Ratio: 12 (ref 10–24)
BUN: 26 mg/dL (ref 8–27)
CO2: 20 mmol/L (ref 20–29)
Calcium: 10.2 mg/dL (ref 8.6–10.2)
Chloride: 102 mmol/L (ref 96–106)
Creatinine, Ser: 2.09 mg/dL — ABNORMAL HIGH (ref 0.76–1.27)
Glucose: 226 mg/dL — ABNORMAL HIGH (ref 70–99)
Potassium: 4.1 mmol/L (ref 3.5–5.2)
Sodium: 138 mmol/L (ref 134–144)
eGFR: 32 mL/min/1.73 — ABNORMAL LOW (ref >59)

## 2024-05-13 ENCOUNTER — Telehealth: Payer: Self-pay | Admitting: *Deleted

## 2024-05-13 ENCOUNTER — Telehealth: Payer: Self-pay

## 2024-05-13 ENCOUNTER — Ambulatory Visit: Admitting: Nurse Practitioner

## 2024-05-14 ENCOUNTER — Telehealth: Payer: Self-pay | Admitting: Cardiology

## 2024-05-14 NOTE — Telephone Encounter (Signed)
 Spoke with pt's wife (per DPR) regarding the death of the pt. Wife was notified that the cath will be canceled. Wife verbalized understanding. All questions if any were answered.

## 2024-05-14 NOTE — Telephone Encounter (Signed)
 Pt's spouse called to advise of pt's passing 7/21 and to cx heart cath

## 2024-05-15 ENCOUNTER — Ambulatory Visit (HOSPITAL_COMMUNITY): Admission: RE | Admit: 2024-05-15 | Source: Home / Self Care | Admitting: Cardiology

## 2024-05-15 ENCOUNTER — Encounter (HOSPITAL_COMMUNITY): Admission: RE | Payer: Self-pay | Source: Home / Self Care

## 2024-05-15 SURGERY — RIGHT/LEFT HEART CATH AND CORONARY ANGIOGRAPHY
Anesthesia: LOCAL

## 2024-05-23 ENCOUNTER — Other Ambulatory Visit

## 2024-05-24 NOTE — Telephone Encounter (Signed)
 Spoke with pt 7/18 regarding medication instructions missed in AVS for his cath. Pt told to hold losartan  the day of and day after procedure. Pt also needs to come 4 hours early for IVF. Pt aware. Pt verbalized understanding. All questions if any were answered.

## 2024-05-24 NOTE — Telephone Encounter (Addendum)
 Per Dr Lavona 05/09/24 Office Note: I am going to have his stop his losartan  and his Lasix  2 days prior. I do not want to admit him for hydration because of his recent volume overload. We can use as little dye as possible.    Patient's wife aware Dr Lavona does not want patient to go in early for hydration.

## 2024-05-24 NOTE — Telephone Encounter (Signed)
 Cardiac Catheterization scheduled at [facility] for: Wednesday May 15, 2024 11:30 AM Arrival time [facility] Main Entrance A at: 9:30 AM  Nothing to eat after midnight prior to procedure, clear liquids until 5 AM day of procedure.  Medication instructions: -Hold:  Eliquis -none 05/18/2024 until post procedure  Metformin-none day of procedure and 48 hours post procedure  Jardiance /Glimepiride-AM of procedure  Losartan /Lasix -day before and day of procedure-per protocol GFR <60 (32) -Other usual morning medications can be taken with sips of water including aspirin  81 mg.  Plan to go home the same day, you will only stay overnight if medically necessary.  You must have responsible adult to drive you home.  Someone must be with you the first 24 hours after you arrive home.  Reviewed procedure instructions with patient's wife (DPR), Sherrell.  Copied from 05/09/24 Office Note per Dr Lavona:.  I am going to have his stop his losartan  and his Lasix  2 days prior. I do not want to admit him for hydration because of his recent volume overload. He is going to hold his Jardiance  and metformin. We can use as little dye as possible.    Patient's wife aware Dr Lavona does not want patient to go in early for hydration.

## 2024-05-24 DEATH — deceased

## 2024-06-14 ENCOUNTER — Ambulatory Visit: Admitting: Cardiology
